# Patient Record
Sex: Female | Born: 1970 | Race: Black or African American | Hispanic: No | Marital: Single | Smoking: Current some day smoker
Health system: Southern US, Community
[De-identification: ages and names within clinical notes are randomized; demographics above are authoritative.]

## PROBLEM LIST (undated history)

## (undated) DIAGNOSIS — J45909 Unspecified asthma, uncomplicated: Secondary | ICD-10-CM

## (undated) HISTORY — PX: OTHER SURGICAL HISTORY: SHX169

---

## 2016-11-10 ENCOUNTER — Emergency Department
Admission: EM | Admit: 2016-11-10 | Discharge: 2016-11-10 | Disposition: A | Discharge status: Home / Self Care | Payer: Self-pay | Attending: Emergency Medicine | Admitting: Emergency Medicine

## 2016-11-10 ENCOUNTER — Emergency Department: Payer: Self-pay

## 2016-11-10 ENCOUNTER — Encounter: Payer: Self-pay | Admitting: Emergency Medicine

## 2016-11-10 DIAGNOSIS — F17200 Nicotine dependence, unspecified, uncomplicated: Secondary | ICD-10-CM | POA: Insufficient documentation

## 2016-11-10 DIAGNOSIS — J45909 Unspecified asthma, uncomplicated: Secondary | ICD-10-CM | POA: Insufficient documentation

## 2016-11-10 DIAGNOSIS — J209 Acute bronchitis, unspecified: Secondary | ICD-10-CM

## 2016-11-10 DIAGNOSIS — J219 Acute bronchiolitis, unspecified: Secondary | ICD-10-CM | POA: Insufficient documentation

## 2016-11-10 HISTORY — DX: Unspecified asthma, uncomplicated: J45.909

## 2016-11-10 LAB — INFLUENZA PANEL BY PCR (TYPE A & B)
INFLAPCR: NEGATIVE
INFLBPCR: NEGATIVE

## 2016-11-10 LAB — CBC
HEMATOCRIT: 35.4 % (ref 35.0–47.0)
HEMOGLOBIN: 12.5 g/dL (ref 12.0–16.0)
MCH: 34.4 pg — ABNORMAL HIGH (ref 26.0–34.0)
MCHC: 35.3 g/dL (ref 32.0–36.0)
MCV: 97.6 fL (ref 80.0–100.0)
Platelets: 110 10*3/uL — ABNORMAL LOW (ref 150–440)
RBC: 3.63 MIL/uL — ABNORMAL LOW (ref 3.80–5.20)
RDW: 13.4 % (ref 11.5–14.5)
WBC: 11.4 10*3/uL — AB (ref 3.6–11.0)

## 2016-11-10 LAB — COMPREHENSIVE METABOLIC PANEL
ALK PHOS: 79 U/L (ref 38–126)
ALT: 11 U/L — ABNORMAL LOW (ref 14–54)
ANION GAP: 8 (ref 5–15)
AST: 18 U/L (ref 15–41)
Albumin: 4.4 g/dL (ref 3.5–5.0)
BILIRUBIN TOTAL: 0.8 mg/dL (ref 0.3–1.2)
BUN: 10 mg/dL (ref 6–20)
CALCIUM: 8.8 mg/dL — AB (ref 8.9–10.3)
CO2: 24 mmol/L (ref 22–32)
Chloride: 105 mmol/L (ref 101–111)
Creatinine, Ser: 0.86 mg/dL (ref 0.44–1.00)
GLUCOSE: 126 mg/dL — AB (ref 65–99)
POTASSIUM: 3.7 mmol/L (ref 3.5–5.1)
Sodium: 137 mmol/L (ref 135–145)
TOTAL PROTEIN: 7.4 g/dL (ref 6.5–8.1)

## 2016-11-10 LAB — LIPASE, BLOOD: Lipase: 20 U/L (ref 11–51)

## 2016-11-10 MED ORDER — ALBUTEROL SULFATE HFA 108 (90 BASE) MCG/ACT IN AERS: 2.0000 | INHALATION_SPRAY | Freq: Four times a day (QID) | RESPIRATORY_TRACT | 2 refills | Status: DC | PRN

## 2016-11-10 MED ORDER — AZITHROMYCIN 500 MG PO TABS
500.0000 mg | ORAL_TABLET | Freq: Once | ORAL | Status: AC
  Administered 2016-11-10: 500 mg via ORAL
  Filled 2016-11-10: qty 1

## 2016-11-10 MED ORDER — ONDANSETRON 4 MG PO TBDP
4.0000 mg | ORAL_TABLET | Freq: Once | ORAL | Status: AC | PRN
  Administered 2016-11-10: 4 mg via ORAL
  Filled 2016-11-10: qty 1

## 2016-11-10 MED ORDER — AZITHROMYCIN 250 MG PO TABS: ORAL_TABLET | ORAL | 0 refills | Status: AC

## 2016-11-10 MED ORDER — ALBUTEROL SULFATE HFA 108 (90 BASE) MCG/ACT IN AERS: 2.0000 | INHALATION_SPRAY | Freq: Four times a day (QID) | RESPIRATORY_TRACT | 0 refills | Status: AC | PRN

## 2016-11-10 MED ORDER — HYDROCOD POLST-CPM POLST ER 10-8 MG/5ML PO SUER: 5.0000 mL | Freq: Two times a day (BID) | ORAL | 0 refills | Status: DC | PRN

## 2016-11-10 MED ORDER — HYDROCOD POLST-CPM POLST ER 10-8 MG/5ML PO SUER
5.0000 mL | Freq: Once | ORAL | Status: AC
  Administered 2016-11-10: 5 mL via ORAL
  Filled 2016-11-10: qty 5

## 2016-11-10 MED ORDER — ACETAMINOPHEN 325 MG PO TABS
650.0000 mg | ORAL_TABLET | Freq: Once | ORAL | Status: AC
  Administered 2016-11-10: 650 mg via ORAL
  Filled 2016-11-10: qty 2

## 2016-11-10 NOTE — ED Triage Notes (Signed)
Pt to triage via WC, reports fever, body aches, cough, vomiting since yesterday.  Pt dry heaving at this time.

## 2016-11-10 NOTE — ED Provider Notes (Signed)
[facility] Emergency Department Provider Note   First MD Initiated Contact with Patient 11/10/16 [phone]     (approximate)  I have reviewed the triage vital signs and the nursing notes.   HISTORY  Chief Complaint Cough; Fever; and Emesis   HPI Cindy Moon is a 46 y.o. female presents with one-day history of generalized body aches fever and nonproductive cough. Patient admits to daily tobacco use approximately 3 cigarettes.   Past Medical History:  Diagnosis Date  . Asthma     There are no active problems to display for this patient.   Past surgical history None  Prior to Admission medications   Not on File    Allergies Sulfa antibiotics  History reviewed. No pertinent family history.  Social History Social History  Substance Use Topics  . Smoking status: Current Some Day Smoker  . Smokeless tobacco: Never Used  . Alcohol use Yes    Review of Systems Constitutional: No fever/chills Eyes: No visual changes. ENT: No sore throat. Cardiovascular: Denies chest pain. Respiratory: Denies shortness of breath. Gastrointestinal: No abdominal pain.  No nausea, no vomiting.  No diarrhea.  No constipation. Genitourinary: Negative for dysuria. Musculoskeletal: Negative for back pain. Skin: Negative for rash. Neurological: Negative for headaches, focal weakness or numbness.  10-point ROS otherwise negative.  ____________________________________________   PHYSICAL EXAM:  VITAL SIGNS: ED Triage Vitals  Enc Vitals Group     BP 11/10/16 0222 126/81     Pulse Rate 11/10/16 0222 (!) 116     Resp 11/10/16 0222 20     Temp 11/10/16 0222 100 F (37.8 C)     Temp Source 11/10/16 0222 Oral     SpO2 11/10/16 0222 99 %     Weight 11/10/16 0223 166 lb (75.3 kg)     Height 11/10/16 0223 5\' 10"  (1.778 m)     Head Circumference --      Peak Flow --      Pain Score 11/10/16 0223 10     Pain Loc --      Pain Edu? --      Excl. in GC? --      Constitutional: Alert and oriented. Well appearing and in no acute distress. Eyes: Conjunctivae are normal. PERRL. EOMI. Head: Atraumatic. Ears:  Healthy appearing ear canals and TMs bilaterally Nose: No congestion/rhinnorhea. Mouth/Throat: Mucous membranes are moist.  Oropharynx non-erythematous. Neck: No stridor.  Cardiovascular: Normal rate, regular rhythm. Good peripheral circulation. Grossly normal heart sounds. Respiratory: Normal respiratory effort.  No retractions. Lungs CTAB. Gastrointestinal: Soft and nontender. No distention.  Musculoskeletal: No lower extremity tenderness nor edema. No gross deformities of extremities. Neurologic:  Normal speech and language. No gross focal neurologic deficits are appreciated.  Skin:  Skin is warm, dry and intact. No rash noted.   ____________________________________________   LABS (all labs ordered are listed, but only abnormal results are displayed)  Labs Reviewed  COMPREHENSIVE METABOLIC PANEL - Abnormal; Notable for the following:       Result Value   Glucose, Bld 126 (*)    Calcium 8.8 (*)    ALT 11 (*)    All other components within normal limits  CBC - Abnormal; Notable for the following:    WBC 11.4 (*)    RBC 3.63 (*)    MCH 34.4 (*)    Platelets 110 (*)    All other components within normal limits  LIPASE, BLOOD  INFLUENZA PANEL BY PCR (TYPE A & B, H1N1)  RADIOLOGY I, [place] Collie Wernick, personally viewed and evaluated these images (plain radiographs) as part of my medical decision making, as well as reviewing the written report by the radiologist.  Dg Chest 2 View  Result Date: 11/10/2016 CLINICAL DATA:  Fever, body aches, cough, and vomiting for 1 day. Current smoker. EXAM: CHEST  2 VIEW COMPARISON:  None. FINDINGS: Hyperinflation suggesting asthma or emphysema. Normal heart size and pulmonary vascularity. No focal airspace disease or consolidation in the lungs. No blunting of costophrenic angles. No  pneumothorax. Mediastinal contours appear intact. IMPRESSION: No active cardiopulmonary disease. Electronically Signed   By: Burman NievesWilliam  Stevens M.D.   On: 11/10/2016 05:34    Procedures     INITIAL IMPRESSION / ASSESSMENT AND PLAN / ED COURSE  Pertinent labs & imaging results that were available during my care of the patient were reviewed by me and considered in my medical decision making (see chart for details).  Patient given azithromycin and tonsillectomy emergency department will be prescribed the same in addition to albuterol inhaler for home.   Clinical Course     ____________________________________________  FINAL CLINICAL IMPRESSION(S) / ED DIAGNOSES  Final diagnoses:  Acute bronchitis, unspecified organism     MEDICATIONS GIVEN DURING THIS VISIT:  Medications  azithromycin (ZITHROMAX) tablet 500 mg (not administered)  chlorpheniramine-HYDROcodone (TUSSIONEX) 10-8 MG/5ML suspension 5 mL (not administered)  ondansetron (ZOFRAN-ODT) disintegrating tablet 4 mg (4 mg Oral Given 11/10/16 0230)  acetaminophen (TYLENOL) tablet 650 mg (650 mg Oral Given 11/10/16 0230)     NEW OUTPATIENT MEDICATIONS STARTED DURING THIS VISIT:  New Prescriptions   No medications on file    Modified Medications   No medications on file    Discontinued Medications   No medications on file     Note:  This document was prepared using Dragon voice recognition software and may include unintentional dictation errors.    Darci Currentandolph N Radhika Dershem, MD 11/10/16 [phone]

## 2017-04-06 ENCOUNTER — Encounter: Payer: Self-pay | Admitting: Emergency Medicine

## 2017-04-06 ENCOUNTER — Emergency Department: Payer: Self-pay

## 2017-04-06 ENCOUNTER — Emergency Department
Admission: EM | Admit: 2017-04-06 | Discharge: 2017-04-06 | Disposition: A | Discharge status: Home / Self Care | Payer: Self-pay | Attending: Emergency Medicine | Admitting: Emergency Medicine

## 2017-04-06 DIAGNOSIS — R1084 Generalized abdominal pain: Secondary | ICD-10-CM | POA: Insufficient documentation

## 2017-04-06 DIAGNOSIS — J45909 Unspecified asthma, uncomplicated: Secondary | ICD-10-CM | POA: Insufficient documentation

## 2017-04-06 DIAGNOSIS — R300 Dysuria: Secondary | ICD-10-CM | POA: Insufficient documentation

## 2017-04-06 DIAGNOSIS — F17200 Nicotine dependence, unspecified, uncomplicated: Secondary | ICD-10-CM | POA: Insufficient documentation

## 2017-04-06 LAB — CHLAMYDIA/NGC RT PCR (ARMC ONLY)
Chlamydia Tr: NOT DETECTED
N gonorrhoeae: NOT DETECTED

## 2017-04-06 LAB — CBC
HCT: 35.9 % (ref 35.0–47.0)
Hemoglobin: 12.4 g/dL (ref 12.0–16.0)
MCH: 33.9 pg (ref 26.0–34.0)
MCHC: 34.5 g/dL (ref 32.0–36.0)
MCV: 98.2 fL (ref 80.0–100.0)
PLATELETS: 144 10*3/uL — AB (ref 150–440)
RBC: 3.66 MIL/uL — ABNORMAL LOW (ref 3.80–5.20)
RDW: 13.5 % (ref 11.5–14.5)
WBC: 6.6 10*3/uL (ref 3.6–11.0)

## 2017-04-06 LAB — HEPATIC FUNCTION PANEL
ALT: 12 U/L — ABNORMAL LOW (ref 14–54)
AST: 21 U/L (ref 15–41)
Albumin: 4.2 g/dL (ref 3.5–5.0)
Alkaline Phosphatase: 68 U/L (ref 38–126)
BILIRUBIN TOTAL: 0.4 mg/dL (ref 0.3–1.2)
Total Protein: 7.6 g/dL (ref 6.5–8.1)

## 2017-04-06 LAB — PREGNANCY, URINE: Preg Test, Ur: NEGATIVE

## 2017-04-06 LAB — WET PREP, GENITAL
Clue Cells Wet Prep HPF POC: NONE SEEN
SPERM: NONE SEEN
Trich, Wet Prep: NONE SEEN
Yeast Wet Prep HPF POC: NONE SEEN

## 2017-04-06 LAB — BASIC METABOLIC PANEL
Anion gap: 7 (ref 5–15)
BUN: 13 mg/dL (ref 6–20)
CHLORIDE: 107 mmol/L (ref 101–111)
CO2: 25 mmol/L (ref 22–32)
CREATININE: 0.84 mg/dL (ref 0.44–1.00)
Calcium: 8.9 mg/dL (ref 8.9–10.3)
GFR calc Af Amer: >60 mL/min (ref >60)
GFR calc non Af Amer: >60 mL/min (ref >60)
Glucose, Bld: 96 mg/dL (ref 65–99)
Potassium: 3.9 mmol/L (ref 3.5–5.1)
SODIUM: 139 mmol/L (ref 135–145)

## 2017-04-06 LAB — URINALYSIS, COMPLETE (UACMP) WITH MICROSCOPIC
BACTERIA UA: NONE SEEN
BILIRUBIN URINE: NEGATIVE
Glucose, UA: NEGATIVE mg/dL
HGB URINE DIPSTICK: NEGATIVE
Ketones, ur: NEGATIVE mg/dL
LEUKOCYTES UA: NEGATIVE
NITRITE: NEGATIVE
Protein, ur: NEGATIVE mg/dL
SPECIFIC GRAVITY, URINE: 1.015 (ref 1.005–1.030)
pH: 7 (ref 5.0–8.0)

## 2017-04-06 LAB — LIPASE, BLOOD: LIPASE: 33 U/L (ref 11–51)

## 2017-04-06 MED ORDER — ONDANSETRON HCL 4 MG/2ML IJ SOLN
4.0000 mg | Freq: Once | INTRAMUSCULAR | Status: AC
  Administered 2017-04-06: 4 mg via INTRAVENOUS

## 2017-04-06 MED ORDER — ONDANSETRON HCL 4 MG/2ML IJ SOLN
INTRAMUSCULAR | Status: AC
  Filled 2017-04-06: qty 2

## 2017-04-06 MED ORDER — MORPHINE SULFATE (PF) 4 MG/ML IV SOLN
4.0000 mg | Freq: Once | INTRAVENOUS | Status: AC
  Administered 2017-04-06: 4 mg via INTRAVENOUS
  Filled 2017-04-06: qty 1

## 2017-04-06 MED ORDER — HYDROCODONE-ACETAMINOPHEN 5-325 MG PO TABS
1.0000 | ORAL_TABLET | ORAL | Status: AC
  Administered 2017-04-06: 1 via ORAL
  Filled 2017-04-06: qty 1

## 2017-04-06 MED ORDER — FOSFOMYCIN TROMETHAMINE 3 G PO PACK
3.0000 g | PACK | ORAL | Status: AC
  Administered 2017-04-06: 3 g via ORAL
  Filled 2017-04-06: qty 3

## 2017-04-06 MED ORDER — ONDANSETRON HCL 4 MG/2ML IJ SOLN
4.0000 mg | Freq: Once | INTRAMUSCULAR | Status: AC
  Administered 2017-04-06: 4 mg via INTRAVENOUS
  Filled 2017-04-06: qty 2

## 2017-04-06 MED ORDER — KETOROLAC TROMETHAMINE 30 MG/ML IJ SOLN
60.0000 mg | Freq: Once | INTRAMUSCULAR | Status: AC
  Administered 2017-04-06: 60 mg via INTRAMUSCULAR
  Filled 2017-04-06: qty 2

## 2017-04-06 MED ORDER — HYDROCODONE-ACETAMINOPHEN 5-325 MG PO TABS
1.0000 | ORAL_TABLET | Freq: Once | ORAL | Status: AC
  Administered 2017-04-06: 1 via ORAL
  Filled 2017-04-06: qty 1

## 2017-04-06 MED ORDER — HYDROCODONE-ACETAMINOPHEN 5-325 MG PO TABS: 1.0000 | ORAL_TABLET | Freq: Four times a day (QID) | ORAL | 0 refills | Status: DC | PRN

## 2017-04-06 NOTE — ED Provider Notes (Addendum)
[facility] Emergency Department Provider Note   ____________________________________________   First MD Initiated Contact with Patient 04/06/17 1104     (approximate)  I have reviewed the triage vital signs and the nursing notes.   HISTORY  Chief Complaint Flank Pain    HPI Cindy Moon is a 46 y.o. female reports about one week ago she began experiencing "a UTI" which she describes as a feeling of an unusual discomfort or pain whenever she was urinating. She has not noticed any fevers or chills, she has not noticed any abnormal odor to her urine or any cloudiness.  Yesterday began experiencing a sharp pain in the left mid flank that radiates up towards her left lower back. She consulted with friends and they thought she might have a urinary infection, or possibly kidney stone.  No nausea or vomiting. No diarrhea. She is eating and drinking normally.  No numbness or tingling. No weakness in the legs. No ripping tearing or moving discomfort. No pain in the middle of the abdomen or mid back.  Currently reports some moderate to severe pain in the left flank  Past Medical History:  Diagnosis Date  . Asthma     There are no active problems to display for this patient.   History reviewed. No pertinent surgical history.  Prior to Admission medications   Medication Sig Start Date End Date Taking? Authorizing Provider  albuterol (PROVENTIL HFA;VENTOLIN HFA) 108 (90 Base) MCG/ACT inhaler Inhale 2 puffs into the lungs every 6 (six) hours as needed for wheezing or shortness of breath. 11/10/16  Yes Darci Current, MD  phenazopyridine (PYRIDIUM) 97 MG tablet Take 97 mg by mouth 3 (three) times daily as needed for pain.   Yes [provider]  HYDROcodone-acetaminophen (NORCO/VICODIN) 5-325 MG tablet Take 1 tablet by mouth every 6 (six) hours as needed for moderate pain. 04/06/17   Sharyn Creamer, MD    Allergies Sulfa antibiotics  No family history  on file.  Social History Social History  Substance Use Topics  . Smoking status: Current Some Day Smoker  . Smokeless tobacco: Never Used  . Alcohol use Yes    Review of Systems Constitutional: No fever/chills Eyes: No visual changes. ENT: No sore throat. Cardiovascular: Denies chest pain. Respiratory: Denies shortness of breath. Gastrointestinal: No abdominal pain.  No nausea, no vomiting. See history of present illness regarding flank pain No diarrhea.  No constipation. Genitourinary: See history of present illness  Denies pregnancy. Denies any vaginal discharge or bleeding. Questions if she may be in menopause as last period was about 2 years ago. Musculoskeletal: See history of present illness. No falls or injuries Skin: Negative for rash. Neurological: Negative for headaches, focal weakness or numbness.    ____________________________________________   PHYSICAL EXAM:  VITAL SIGNS: ED Triage Vitals  Enc Vitals Group     BP 04/06/17 0939 118/88     Pulse Rate 04/06/17 0939 78     Resp 04/06/17 0939 20     Temp 04/06/17 0939 97.7 F (36.5 C)     Temp Source 04/06/17 0939 Oral     SpO2 04/06/17 0939 100 %     Weight 04/06/17 0939 170 lb (77.1 kg)     Height 04/06/17 0939 5\' 10"  (1.778 m)     Head Circumference --      Peak Flow --      Pain Score 04/06/17 0941 10     Pain Loc --  Pain Edu? --      Excl. in GC? --     Constitutional: Alert and oriented. Well appearing and in no acute distress.She and her husband both very pleasant. Eyes: Conjunctivae are normal. Head: Atraumatic. Nose: No congestion/rhinnorhea. Mouth/Throat: Mucous membranes are moist. Neck: No stridor.   Cardiovascular: Normal rate, regular rhythm. Grossly normal heart sounds.  Good peripheral circulation. Respiratory: Normal respiratory effort.  No retractions. Lungs CTAB. Gastrointestinal: Soft and nontender except for mild pain to palpation in the left lower quadrant and flank. No  distention. There is moderate left CVA tenderness, none on the right. Musculoskeletal: No lower extremity tenderness nor edema. Neurologic:  Normal speech and language. No gross focal neurologic deficits are appreciated.  Skin:  Skin is warm, dry and intact. No rash noted. Psychiatric: Mood and affect are normal. Speech and behavior are normal.  ____________________________________________   LABS (all labs ordered are listed, but only abnormal results are displayed)  Labs Reviewed  WET PREP, GENITAL - Abnormal; Notable for the following:       Result Value   WBC, Wet Prep HPF POC MODERATE (*)    All other components within normal limits  URINALYSIS, COMPLETE (UACMP) WITH MICROSCOPIC - Abnormal; Notable for the following:    Color, Urine YELLOW (*)    APPearance CLEAR (*)    Squamous Epithelial / LPF 0-5 (*)    All other components within normal limits  CBC - Abnormal; Notable for the following:    RBC 3.66 (*)    Platelets 144 (*)    All other components within normal limits  HEPATIC FUNCTION PANEL - Abnormal; Notable for the following:    ALT 12 (*)    Bilirubin, Direct <0.1 (*)    All other components within normal limits  CHLAMYDIA/NGC RT PCR ([facility] ONLY)  BASIC METABOLIC PANEL  PREGNANCY, URINE  LIPASE, BLOOD   ____________________________________________  EKG   ____________________________________________  RADIOLOGY  [place] Transvaginal Non-ob  Result Date: 04/06/2017 CLINICAL DATA:  Left pelvic pain EXAM: TRANSABDOMINAL AND TRANSVAGINAL ULTRASOUND OF PELVIS TECHNIQUE: Both transabdominal and transvaginal ultrasound examinations of the pelvis were performed. Transabdominal technique was performed for global imaging of the pelvis including uterus, ovaries, adnexal regions, and pelvic cul-de-sac. It was necessary to proceed with endovaginal exam following the transabdominal exam to visualize the uterus and ovaries. COMPARISON:  CT abdomen pelvis 04/06/2017  FINDINGS: Uterus Measurements: 7.2 x 3.9 x 4.1 cm. No fibroids or other mass visualized. Endometrium Thickness: 3 mm.  No focal abnormality visualized. Right ovary Not clearly visualized, possibly obscured by nearby bowel. Left ovary Not clearly visualized, possibly obscured by nearby bowel. Other findings No abnormal free fluid. IMPRESSION: 1. Normal appearance of the uterus. 2. Nonvisualization of the ovaries due to nearby bowel gas. 3. No free fluid in the pelvis. Electronically Signed   By: Deatra RobinsonKevin  Herman M.D.   On: 04/06/2017 19:34   [place] Pelvis Complete  Result Date: 04/06/2017 CLINICAL DATA:  Left pelvic pain EXAM: TRANSABDOMINAL AND TRANSVAGINAL ULTRASOUND OF PELVIS TECHNIQUE: Both transabdominal and transvaginal ultrasound examinations of the pelvis were performed. Transabdominal technique was performed for global imaging of the pelvis including uterus, ovaries, adnexal regions, and pelvic cul-de-sac. It was necessary to proceed with endovaginal exam following the transabdominal exam to visualize the uterus and ovaries. COMPARISON:  CT abdomen pelvis 04/06/2017 FINDINGS: Uterus Measurements: 7.2 x 3.9 x 4.1 cm. No fibroids or other mass visualized. Endometrium Thickness: 3 mm.  No focal abnormality visualized. Right ovary Not clearly  visualized, possibly obscured by nearby bowel. Left ovary Not clearly visualized, possibly obscured by nearby bowel. Other findings No abnormal free fluid. IMPRESSION: 1. Normal appearance of the uterus. 2. Nonvisualization of the ovaries due to nearby bowel gas. 3. No free fluid in the pelvis. Electronically Signed   By: Deatra Robinson M.D.   On: 04/06/2017 19:34   Ct Renal Stone Study  Result Date: 04/06/2017 CLINICAL DATA:  Left flank pain EXAM: CT ABDOMEN AND PELVIS WITHOUT CONTRAST TECHNIQUE: Multidetector CT imaging of the abdomen and pelvis was performed following the standard protocol without oral or intravenous contrast material administration. COMPARISON:  None.  FINDINGS: Lower chest: Lung bases are clear. Hepatobiliary: No focal liver lesions are apparent on this noncontrast enhanced study. Gallbladder wall is not appreciably thickened. There is no biliary duct dilatation. Pancreas: There is no pancreatic mass or inflammatory focus. Spleen: No splenic lesions are evident. Adrenals/Urinary Tract: Adrenals appear normal bilaterally. Kidneys bilaterally show no evidence of mass or hydronephrosis on either side. There is no appreciable renal or ureteral calculus on either side. Urinary bladder is midline with wall thickness within normal limits. Stomach/Bowel: There is no appreciable bowel wall or mesenteric thickening. There is no bowel obstruction. No free air or portal venous air. Vascular/Lymphatic: There is no abdominal aortic aneurysm. No vascular lesion evident on this noncontrast enhanced study. There is no appreciable adenopathy in the abdomen or pelvis. Reproductive: Uterus is anteverted.  There is no pelvic mass. Other: There is a sebaceous cyst in the posterior right buttocks region may reassuring 1.8 x 1.5 cm. Appendix appears unremarkable. There is no ascites or abscess in the abdomen or pelvis. There is a small ventral hernia containing only fat. There is fat in each inguinal ring. There is a 5 mm focus of calcification mesenteric a of the anterior abdomen near the umbilicus. No similar calcifications seen elsewhere. Musculoskeletal: There are no blastic or lytic bone lesions. There is no intramuscular or abdominal wall lesion. : A cause for patient's symptoms has not been established with this study. No renal or ureteral calculus. No hydronephrosis. Urinary bladder wall thickness is normal. No bowel wall thickening or bowel obstruction. No abscess. Appendix appears normal. Sebaceous cyst in the posterior right buttocks region. Small ventral hernia containing only fat. Small calcification in the anterior abdomen without surrounding edema or mesenteric stranding.  This isolated small calcification may have posttraumatic etiology. Its etiology is frankly uncertain. It appears benign. Electronically Signed   By: Bretta Bang III M.D.   On: 04/06/2017 12:54    ____________________________________________   PROCEDURES  Procedure(s) performed: None  Procedures  Critical Care performed: No  ____________________________________________   INITIAL IMPRESSION / ASSESSMENT AND PLAN / ED COURSE  Pertinent labs & imaging results that were available during my care of the patient were reviewed by me and considered in my medical decision making (see chart for details).  Differential diagnosis includes but is not limited to, abdominal perforation, aortic dissection, cholecystitis, appendicitis, diverticulitis, colitis, esophagitis/gastritis, kidney stone, pyelonephritis, urinary tract infection,etc All are considered in decision and treatment plan. Based upon the patient's presentation and risk factors, she reports dysuria for about a week with associated now sharp left flank pain. Sounds primarily of a probable renal, bladder or possible ureteral source but also consider pelvic etiologies. No ripping tearing or moving pain, no signs of aneurysm. Equal and normal pulses bilaterally without any neurologic symptoms or evidence of AAA, peritonitis, mesenteric ischemia, or dissection.  No cardiac or pulmonary symptoms  Clinical Course as of Apr 06 2113  Wynelle Link Apr 06, 2017  1154 CT Renal Stone Study [MQ]  1241 Rechecked on patient. She is now resting, reports pain is much improved. Appears in no distress. Awaiting results of CT  [MQ]  1747 Pelvic exam performed with Dawayne Cirri as well as [place].No cervical motion tenderness. No adnexal fullness or pain detected. The patient does have some mild somewhat thin white discharge from the os which is been sent for wet prep and GC. Patient reports only sexually active with her husband  [MQ]    Clinical Course User  Index [MQ] Sharyn Creamer, MD    ----------------------------------------- 9:12 PM on 04/06/2017 -----------------------------------------  Patient's pain is improving. She is awake and alert. He is been seen in consult by gynecology, report that they find no evidence of acute GYN source. The patient feels comfortable now, nourished requesting to go home. I reevaluated her, she appears comfortable, alert in no distress. Recommended she follow up with Dr. Logan Bores given the location of her pain, as well as primary care.  I will prescribe the patient a narcotic pain medicine due to their condition which I anticipate will cause at least moderate pain short term. I discussed with the patient safe use of narcotic pain medicines, and that they are not to drive, work in dangerous areas, or ever take more than prescribed (no more than 1 pill every 6 hours). We discussed that this is the type of medication that can be  overdosed on and the risks of this type of medicine. Patient is very agreeable to only use as prescribed and to never use more than prescribed.   Return precautions and treatment recommendations and follow-up discussed with the patient who is agreeable with the plan.  ____________________________________________   FINAL CLINICAL IMPRESSION(S) / ED DIAGNOSES  Final diagnoses:  Dysuria  left flank pain, acute    NEW MEDICATIONS STARTED DURING THIS VISIT:  New Prescriptions   HYDROCODONE-ACETAMINOPHEN (NORCO/VICODIN) 5-325 MG TABLET    Take 1 tablet by mouth every 6 (six) hours as needed for moderate pain.     Note:  This document was prepared using Dragon voice recognition software and may include unintentional dictation errors.     Sharyn Creamer, MD 04/06/17 2114    Sharyn Creamer, MD 04/06/17 2114

## 2017-04-06 NOTE — ED Notes (Signed)
Pt given urine specimen cup; unable to void at this time. 

## 2017-04-06 NOTE — Discharge Instructions (Signed)
You were seen in the emergency room for left flank and abdominal pain. It is important that you follow up closely with your primary care doctor in the next couple of days.  Please return to the emergency room right away if you are to develop a fever, severe nausea, your pain becomes severe or worsens, you are unable to keep food down, begin vomiting any dark or bloody fluid, you develop any dark or bloody stools, feel dehydrated, or other new concerns or symptoms arise.

## 2017-04-06 NOTE — ED Notes (Signed)
Called X 3 for room, no answer

## 2017-04-06 NOTE — ED Triage Notes (Signed)
Patient presents to the ED with sharp left flank pain since yesterday.  Patient reports UTI symptoms that began about 1 week ago.  Patient states she has been taking over the counter, AZO without relief.  Patient reports continued dysuria, frequency and urgency.  Patient states it is very painful now to bend over.

## 2017-05-13 ENCOUNTER — Emergency Department
Admission: EM | Admit: 2017-05-13 | Discharge: 2017-05-13 | Discharge status: Against Medical Advice | Payer: Self-pay | Attending: Emergency Medicine | Admitting: Emergency Medicine

## 2017-05-13 DIAGNOSIS — Z5321 Procedure and treatment not carried out due to patient leaving prior to being seen by health care provider: Secondary | ICD-10-CM | POA: Insufficient documentation

## 2017-05-13 DIAGNOSIS — F411 Generalized anxiety disorder: Secondary | ICD-10-CM | POA: Insufficient documentation

## 2017-05-13 LAB — BASIC METABOLIC PANEL
ANION GAP: 10 (ref 5–15)
BUN: 10 mg/dL (ref 6–20)
CALCIUM: 9.4 mg/dL (ref 8.9–10.3)
CO2: 23 mmol/L (ref 22–32)
Chloride: 107 mmol/L (ref 101–111)
Creatinine, Ser: 0.84 mg/dL (ref 0.44–1.00)
Glucose, Bld: 97 mg/dL (ref 65–99)
Potassium: 3.6 mmol/L (ref 3.5–5.1)
SODIUM: 140 mmol/L (ref 135–145)

## 2017-05-13 LAB — CBC
HCT: 36.9 % (ref 35.0–47.0)
HEMOGLOBIN: 12.8 g/dL (ref 12.0–16.0)
MCH: 34.5 pg — ABNORMAL HIGH (ref 26.0–34.0)
MCHC: 34.8 g/dL (ref 32.0–36.0)
MCV: 99.2 fL (ref 80.0–100.0)
Platelets: 123 10*3/uL — ABNORMAL LOW (ref 150–440)
RBC: 3.71 MIL/uL — AB (ref 3.80–5.20)
RDW: 13.5 % (ref 11.5–14.5)
WBC: 7.2 10*3/uL (ref 3.6–11.0)

## 2017-05-13 NOTE — ED Notes (Signed)
Called in waiting room.  No answer. 

## 2017-05-13 NOTE — ED Triage Notes (Signed)
Pt comes into the ED via EMS from home. States her 2 dogs were fighting and she spent an hour breaking them apart. Pt states it started like an asthma attack and then turned into a panic attack and her muscles locked up . Pt now c/o just being sore all over. Respirations WNL

## 2017-11-05 ENCOUNTER — Encounter: Payer: Self-pay | Admitting: Emergency Medicine

## 2017-11-05 ENCOUNTER — Emergency Department
Admission: EM | Admit: 2017-11-05 | Discharge: 2017-11-05 | Disposition: A | Discharge status: Home / Self Care | Payer: Self-pay | Attending: Emergency Medicine | Admitting: Emergency Medicine

## 2017-11-05 ENCOUNTER — Other Ambulatory Visit: Payer: Self-pay

## 2017-11-05 DIAGNOSIS — J40 Bronchitis, not specified as acute or chronic: Secondary | ICD-10-CM | POA: Insufficient documentation

## 2017-11-05 DIAGNOSIS — F17200 Nicotine dependence, unspecified, uncomplicated: Secondary | ICD-10-CM | POA: Insufficient documentation

## 2017-11-05 DIAGNOSIS — J069 Acute upper respiratory infection, unspecified: Secondary | ICD-10-CM | POA: Insufficient documentation

## 2017-11-05 MED ORDER — AZITHROMYCIN 250 MG PO TABS: ORAL_TABLET | ORAL | 0 refills | Status: DC

## 2017-11-05 MED ORDER — GUAIFENESIN-CODEINE 100-10 MG/5ML PO SOLN: 10.0000 mL | Freq: Three times a day (TID) | ORAL | 0 refills | Status: DC | PRN

## 2017-11-05 NOTE — ED Provider Notes (Signed)
[facility] Emergency Department Provider Note  ____________________________________________  Time seen: Approximately 7:19 PM  I have reviewed the triage vital signs and the nursing notes.   HISTORY  Chief Complaint URI   HPI Encarnacion Slatesara Ruhlman is a 47 y.o. female who presents to the emergency department for evaluation and treatment of a cough times 3 weeks.  Runny nose and sore throat started approximately 1 week ago.  Subjective fever.  No relief with over-the-counter medications.  Patient has a significant past medical history for asthma.  She does smoke cigarettes. Past Medical History:  Diagnosis Date  . Asthma     There are no active problems to display for this patient.   No past surgical history on file.  Prior to Admission medications   Medication Sig Start Date End Date Taking? Authorizing Provider  albuterol (PROVENTIL HFA;VENTOLIN HFA) 108 (90 Base) MCG/ACT inhaler Inhale 2 puffs into the lungs every 6 (six) hours as needed for wheezing or shortness of breath. 11/10/16   Darci CurrentBrown, [place] N, MD  azithromycin (ZITHROMAX) 250 MG tablet 2 tablets today, then 1 tablet for the next 4 days. 11/05/17   Soleia Badolato B, FNP  guaiFENesin-codeine 100-10 MG/5ML syrup Take 10 mLs by mouth 3 (three) times daily as needed. 11/05/17   Abbi Mancini, Kasandra Knudsenari B, FNP  phenazopyridine (PYRIDIUM) 97 MG tablet Take 97 mg by mouth 3 (three) times daily as needed for pain.    [provider]    Allergies Sulfa antibiotics  No family history on file.  Social History Social History   Tobacco Use  . Smoking status: Current Some Day Smoker  . Smokeless tobacco: Never Used  Substance Use Topics  . Alcohol use: Yes  . Drug use: No    Review of Systems Constitutional: Positive for fever/chills ENT: Positive sore throat. Cardiovascular: Denies chest pain. Respiratory: Negative for shortness of breath.  Positive for cough. Gastrointestinal: Negative for nausea, no  vomiting.  No diarrhea.  Musculoskeletal: Positive for body aches Skin: Negative for rash. Neurological: Negative for headaches ____________________________________________   PHYSICAL EXAM:  VITAL SIGNS: ED Triage Vitals  Enc Vitals Group     BP 11/05/17 1813 120/81     Pulse Rate 11/05/17 1813 88     Resp 11/05/17 1813 18     Temp 11/05/17 1813 97.6 F (36.4 C)     Temp Source 11/05/17 1813 Oral     SpO2 11/05/17 1813 100 %     Weight 11/05/17 1814 170 lb (77.1 kg)     Height 11/05/17 1814 5\' 10"  (1.778 m)     Head Circumference --      Peak Flow --      Pain Score 11/05/17 1813 8     Pain Loc --      Pain Edu? --      Excl. in GC? --     Constitutional: Alert and oriented.  Acutely ill appearing and in no acute distress. Eyes: Conjunctivae are normal. EOMI. Nose: Sinus congestion noted; no rhinnorhea. Mouth/Throat: Mucous membranes are moist.  Oropharynx clear. Tonsils clear. Neck: No stridor.  Lymphatic: No cervical lymphadenopathy. Cardiovascular: Normal rate, regular rhythm. Good peripheral circulation. Respiratory: Normal respiratory effort.  No retractions.  Rhonchi in the right lower lobe that clears after cough. Gastrointestinal: Soft and nontender.  Musculoskeletal: FROM x 4 extremities.  Neurologic:  Normal speech and language.  Skin:  Skin is warm, dry and intact. No rash noted. Psychiatric: Mood and affect are normal. Speech and  behavior are normal.  ____________________________________________   LABS (all labs ordered are listed, but only abnormal results are displayed)  Labs Reviewed - No data to display ____________________________________________  EKG  Not indicated ____________________________________________  RADIOLOGY  Not indicated ____________________________________________   PROCEDURES  Procedure(s) performed: None  Critical Care performed: No ____________________________________________   INITIAL IMPRESSION / ASSESSMENT  AND PLAN / ED COURSE  47 year old female with a 3-week history of cough and 1 week history of sore throat and rhinorrhea.  Symptoms and exam are most consistent with a bronchitis and superimposed viral respiratory infection.  She will be given a prescription for azithromycin and Robitussin-AC.  She was encouraged to continue using her albuterol inhaler as prescribed.  Patient will be advised to follow-up with the primary care provider for choice for symptoms that are not improving over the next several days or return to the emergency department for symptoms of concern.  Medications - No data to display  ED Discharge Orders        Ordered    azithromycin (ZITHROMAX) 250 MG tablet     11/05/17 1925    guaiFENesin-codeine 100-10 MG/5ML syrup  3 times daily PRN     11/05/17 1925      Pertinent labs & imaging results that were available during my care of the patient were reviewed by me and considered in my medical decision making (see chart for details).    If controlled substance prescribed during this visit, 12 month history viewed on the [place] prior to issuing an initial prescription for Schedule II or III opiod. ____________________________________________   FINAL CLINICAL IMPRESSION(S) / ED DIAGNOSES  Final diagnoses:  Upper respiratory tract infection, unspecified type  Bronchitis    Note:  This document was prepared using Dragon voice recognition software and may include unintentional dictation errors.     Chinita Pester, FNP 11/05/17 1926    Sharman Cheek, MD 11/05/17 [phone]

## 2017-11-05 NOTE — ED Notes (Signed)
See triage note. Cough,runny nose and sore throat which started about 1 week ago  Unsure of fever but afebrile on arrival

## 2017-11-05 NOTE — ED Triage Notes (Signed)
Pt reports cough, runny nose and sore throat for one week.

## 2018-01-24 ENCOUNTER — Other Ambulatory Visit: Payer: Self-pay

## 2018-01-24 ENCOUNTER — Encounter: Payer: Self-pay | Admitting: Emergency Medicine

## 2018-01-24 ENCOUNTER — Emergency Department
Admission: EM | Admit: 2018-01-24 | Discharge: 2018-01-24 | Disposition: A | Discharge status: Home / Self Care | Payer: Medicaid Other | Attending: Emergency Medicine | Admitting: Emergency Medicine

## 2018-01-24 DIAGNOSIS — J45909 Unspecified asthma, uncomplicated: Secondary | ICD-10-CM | POA: Insufficient documentation

## 2018-01-24 DIAGNOSIS — L304 Erythema intertrigo: Secondary | ICD-10-CM | POA: Insufficient documentation

## 2018-01-24 DIAGNOSIS — F17210 Nicotine dependence, cigarettes, uncomplicated: Secondary | ICD-10-CM | POA: Insufficient documentation

## 2018-01-24 MED ORDER — KETOCONAZOLE 2 % EX CREA: 1.0000 "application " | TOPICAL_CREAM | Freq: Every day | CUTANEOUS | 0 refills | Status: AC

## 2018-01-24 MED ORDER — HYDROCORTISONE 2.5 % EX CREA: TOPICAL_CREAM | Freq: Two times a day (BID) | CUTANEOUS | 0 refills | Status: AC

## 2018-01-24 NOTE — ED Notes (Signed)
Pt also reports a boil located around her anus. Pt reports it has been there for over a week but has recently grown in size and become more painful. No drainage or fevers reported.

## 2018-01-24 NOTE — ED Triage Notes (Signed)
C/O rash under breasts and chest x 1 week.  C/O rash itching.

## 2018-01-24 NOTE — ED Provider Notes (Signed)
[facility] Emergency Department Provider Note  ____________________________________________  Time seen: Approximately 11:17 AM  I have reviewed the triage vital signs and the nursing notes.   HISTORY  Chief Complaint Rash    HPI Cindy Moon is a 47 y.o. female that presents emergency department for evaluation of worsening pruritic rash under bilateral breasts for 1 week.  She is unaware of any medical problems but states she has not seen primary care in a long time.  No fever, nausea, vomiting, abdominal pain.  Past Medical History:  Diagnosis Date  . Asthma     There are no active problems to display for this patient.   History reviewed. No pertinent surgical history.  Prior to Admission medications   Medication Sig Start Date End Date Taking? Authorizing Provider  albuterol (PROVENTIL HFA;VENTOLIN HFA) 108 (90 Base) MCG/ACT inhaler Inhale 2 puffs into the lungs every 6 (six) hours as needed for wheezing or shortness of breath. 11/10/16   Darci CurrentBrown, [place] N, MD  azithromycin (ZITHROMAX) 250 MG tablet 2 tablets today, then 1 tablet for the next 4 days. 11/05/17   Triplett, Cari B, FNP  guaiFENesin-codeine 100-10 MG/5ML syrup Take 10 mLs by mouth 3 (three) times daily as needed. 11/05/17   Triplett, Cari B, FNP  hydrocortisone 2.5 % cream Apply topically 2 (two) times daily. 01/24/18   Enid DerryWagner, [place]iloh [place], PA-C  ketoconazole (NIZORAL) 2 % cream Apply 1 application topically daily. 01/24/18   Enid DerryWagner, Gaudencio Chesnut, PA-C  phenazopyridine (PYRIDIUM) 97 MG tablet Take 97 mg by mouth 3 (three) times daily as needed for pain.    [provider]    Allergies Sulfa antibiotics  No family history on file.  Social History Social History   Tobacco Use  . Smoking status: Current Some Day Smoker    Packs/day: 0.10    Types: Cigarettes  . Smokeless tobacco: Never Used  Substance Use Topics  . Alcohol use: Yes  . Drug use: No     Review of Systems   Gastrointestinal:  No nausea, no vomiting.  Musculoskeletal: Negative for musculoskeletal pain. Skin: Negative for abrasions, lacerations, ecchymosis.  Positive for rash. Neurological: Negative for numbness or tingling   ____________________________________________   PHYSICAL EXAM:  VITAL SIGNS: ED Triage Vitals  Enc Vitals Group     BP 01/24/18 1028 (!) 152/85     Pulse Rate 01/24/18 1028 84     Resp 01/24/18 1028 20     Temp 01/24/18 1028 (!) 97.4 F (36.3 C)     Temp Source 01/24/18 1028 Oral     SpO2 01/24/18 1028 99 %     Weight 01/24/18 1027 180 lb (81.6 kg)     Height 01/24/18 1027 5\' 7"  (1.702 m)     Head Circumference --      Peak Flow --      Pain Score 01/24/18 1026 0     Pain Loc --      Pain Edu? --      Excl. in GC? --      Constitutional: Alert and oriented. Well appearing and in no acute distress. Eyes: Conjunctivae are normal. PERRL. EOMI. Head: Atraumatic. ENT:      Ears:      Nose: No congestion/rhinnorhea.      Mouth/Throat: Mucous membranes are moist.  Neck: No stridor.  Cardiovascular: Normal rate, regular rhythm.  Good peripheral circulation. Respiratory: Normal respiratory effort without tachypnea or retractions. Lungs CTAB. Good air entry to the bases with no decreased  or absent breath sounds. Musculoskeletal: Full range of motion to all extremities. No gross deformities appreciated. Neurologic:  Normal speech and language. No gross focal neurologic deficits are appreciated.  Skin:  Skin is warm, dry and intact. Moist, red, beefy, homogenous patches under bilaterally breasts.   ____________________________________________   LABS (all labs ordered are listed, but only abnormal results are displayed)  Labs Reviewed - No data to display ____________________________________________  EKG   ____________________________________________  RADIOLOGY   No results  found.  ____________________________________________    PROCEDURES  Procedure(s) performed:    Procedures    Medications - No data to display   ____________________________________________   INITIAL IMPRESSION / ASSESSMENT AND PLAN / ED COURSE  Pertinent labs & imaging results that were available during my care of the patient were reviewed by me and considered in my medical decision making (see chart for details).  Review of the [place] CSRS was performed in accordance of the [facility] prior to dispensing any controlled drugs.     Patient's diagnosis is consistent with intertrigo. Patient will be discharged home with prescriptions for ketoconazole and hydrocortisone cream. Patient is to follow up with PCP and dermatology as directed. Resources for free and reduced cost healthcare in [facility] were given. Patient is given ED precautions to return to the ED for any worsening or new symptoms.     ____________________________________________  FINAL CLINICAL IMPRESSION(S) / ED DIAGNOSES  Final diagnoses:  Intertrigo      NEW MEDICATIONS STARTED DURING THIS VISIT:  ED Discharge Orders        Ordered    ketoconazole (NIZORAL) 2 % cream  Daily     01/24/18 1111    hydrocortisone 2.5 % cream  2 times daily     01/24/18 1111          This chart was dictated using voice recognition software/Dragon. Despite best efforts to proofread, errors can occur which can change the meaning. Any change was purely unintentional.    Enid Derry, PA-C 01/24/18 1427    Sharyn Creamer, MD 01/24/18 [phone]

## 2018-02-20 ENCOUNTER — Other Ambulatory Visit: Payer: Self-pay

## 2018-02-20 ENCOUNTER — Encounter: Payer: Self-pay | Admitting: Emergency Medicine

## 2018-02-20 ENCOUNTER — Emergency Department
Admission: EM | Admit: 2018-02-20 | Discharge: 2018-02-20 | Disposition: A | Discharge status: Home / Self Care | Payer: Medicaid Other | Attending: Student in an Organized Health Care Education/Training Program | Admitting: Student in an Organized Health Care Education/Training Program

## 2018-02-20 DIAGNOSIS — J45909 Unspecified asthma, uncomplicated: Secondary | ICD-10-CM | POA: Insufficient documentation

## 2018-02-20 DIAGNOSIS — B9789 Other viral agents as the cause of diseases classified elsewhere: Secondary | ICD-10-CM | POA: Insufficient documentation

## 2018-02-20 DIAGNOSIS — F17210 Nicotine dependence, cigarettes, uncomplicated: Secondary | ICD-10-CM | POA: Insufficient documentation

## 2018-02-20 DIAGNOSIS — J069 Acute upper respiratory infection, unspecified: Secondary | ICD-10-CM | POA: Insufficient documentation

## 2018-02-20 DIAGNOSIS — J040 Acute laryngitis: Secondary | ICD-10-CM

## 2018-02-20 MED ORDER — PSEUDOEPH-BROMPHEN-DM 30-2-10 MG/5ML PO SYRP: 5.0000 mL | ORAL_SOLUTION | Freq: Four times a day (QID) | ORAL | 0 refills | Status: DC | PRN

## 2018-02-20 MED ORDER — DEXAMETHASONE SODIUM PHOSPHATE 10 MG/ML IJ SOLN
10.0000 mg | Freq: Once | INTRAMUSCULAR | Status: AC
  Administered 2018-02-20: 10 mg via INTRAMUSCULAR
  Filled 2018-02-20: qty 1

## 2018-02-20 MED ORDER — DIPHENHYDRAMINE HCL 12.5 MG/5ML PO ELIX
12.5000 mg | ORAL_SOLUTION | Freq: Once | ORAL | Status: AC
  Administered 2018-02-20: 12.5 mg via ORAL
  Filled 2018-02-20: qty 5

## 2018-02-20 MED ORDER — LIDOCAINE VISCOUS 2 % MT SOLN
15.0000 mL | Freq: Once | OROMUCOSAL | Status: AC
  Administered 2018-02-20: 15 mL via OROMUCOSAL
  Filled 2018-02-20: qty 15

## 2018-02-20 MED ORDER — METHYLPREDNISOLONE 4 MG PO TBPK: ORAL_TABLET | ORAL | 0 refills | Status: DC

## 2018-02-20 MED ORDER — LIDOCAINE VISCOUS 2 % MT SOLN: 5.0000 mL | Freq: Four times a day (QID) | OROMUCOSAL | 0 refills | Status: DC | PRN

## 2018-02-20 NOTE — ED Triage Notes (Signed)
Sore throat and cough x 3 days

## 2018-02-20 NOTE — ED Notes (Signed)
See triage note  Presents with cough,sinus drainage and hoarseness   States sx's started about 3 days ago  Denies any fever

## 2018-02-20 NOTE — ED Provider Notes (Signed)
[facility] Emergency Department Provider Note   ____________________________________________   First MD Initiated Contact with Patient 02/20/18 1700     (approximate)  I have reviewed the triage vital signs and the nursing notes.   HISTORY  Chief Complaint Sore Throat    HPI Cindy Moon is a 47 y.o. female patient presents with sore throat, cough, and decreased voice volume for 3 days.  Patient denies fever or chills associated with this complaint.  Patient states she has cough which is nonproductive.  Patient rates the pain as a 7/10.  Patient describes pain as "ache".  No palliative measures for complaint.  Patient stated to tolerate food and fluids with discomfort.  Past Medical History:  Diagnosis Date  . Asthma     There are no active problems to display for this patient.   History reviewed. No pertinent surgical history.  Prior to Admission medications   Medication Sig Start Date End Date Taking? Authorizing Provider  albuterol (PROVENTIL HFA;VENTOLIN HFA) 108 (90 Base) MCG/ACT inhaler Inhale 2 puffs into the lungs every 6 (six) hours as needed for wheezing or shortness of breath. 11/10/16   Darci Current, MD  azithromycin (ZITHROMAX) 250 MG tablet 2 tablets today, then 1 tablet for the next 4 days. 11/05/17   Triplett, Rulon Eisenmenger B, FNP  brompheniramine-pseudoephedrine-DM 30-2-10 MG/5ML syrup Take 5 mLs by mouth 4 (four) times daily as needed. Mixed with 5 mL of Viscous Lidocaine swish and swallow. 02/20/18   Joni Reining, PA-C  guaiFENesin-codeine 100-10 MG/5ML syrup Take 10 mLs by mouth 3 (three) times daily as needed. 11/05/17   Triplett, Cari B, FNP  hydrocortisone 2.5 % cream Apply topically 2 (two) times daily. 01/24/18   Enid Derry, PA-C  ketoconazole (NIZORAL) 2 % cream Apply 1 application topically daily. 01/24/18   Enid Derry, PA-C  lidocaine (XYLOCAINE) 2 % solution Use as directed 5 mLs in the mouth or throat every 6 (six) hours as  needed for mouth pain. Mixed with 5 mL of Bromfed-DM for swish and swallow. 02/20/18   Joni Reining, PA-C  methylPREDNISolone (MEDROL DOSEPAK) 4 MG TBPK tablet Take Tapered dose as directed 02/20/18   Joni Reining, PA-C  phenazopyridine (PYRIDIUM) 97 MG tablet Take 97 mg by mouth 3 (three) times daily as needed for pain.    [provider]    Allergies Sulfa antibiotics  No family history on file.  Social History Social History   Tobacco Use  . Smoking status: Current Some Day Smoker    Packs/day: 0.10    Types: Cigarettes  . Smokeless tobacco: Never Used  Substance Use Topics  . Alcohol use: Yes  . Drug use: No    Review of Systems Constitutional: No fever/chills Eyes: No visual changes. ENT: Sore throat and decreased voice volume. Cardiovascular: Denies chest pain. Respiratory: Denies shortness of breath. Gastrointestinal: No abdominal pain.  No nausea, no vomiting.  No diarrhea.  No constipation. Genitourinary: Negative for dysuria. Musculoskeletal: Negative for back pain. Skin: Negative for rash. Neurological: Negative for headaches, focal weakness or numbness. Allergic/Immunilogical: Sulfa antibiotics. ____________________________________________   PHYSICAL EXAM:  VITAL SIGNS: ED Triage Vitals  Enc Vitals Group     BP 02/20/18 1651 (!) 115/91     Pulse Rate 02/20/18 1651 79     Resp 02/20/18 1651 16     Temp 02/20/18 1651 98.1 F (36.7 C)     Temp Source 02/20/18 1651 Oral     SpO2 02/20/18 1651  100 %     Weight 02/20/18 1651 165 lb (74.8 kg)     Height 02/20/18 1651 5\' 10"  (1.778 m)     Head Circumference --      Peak Flow --      Pain Score 02/20/18 1650 7     Pain Loc --      Pain Edu? --      Excl. in GC? --    Constitutional: Alert and oriented. Well appearing and in no acute distress. Neck: No stridor.  Hematological/Lymphatic/Immunilogical: No cervical lymphadenopathy. Cardiovascular: Normal rate, regular rhythm. Grossly normal  heart sounds.  Good peripheral circulation. Respiratory: Normal respiratory effort.  No retractions. Lungs CTAB. Neurologic:  Normal speech and language. No gross focal neurologic deficits are appreciated. No gait instability. Skin:  Skin is warm, dry and intact. No rash noted. Psychiatric: Mood and affect are normal. Speech and behavior are normal.  ____________________________________________   LABS (all labs ordered are listed, but only abnormal results are displayed)  Labs Reviewed - No data to display ____________________________________________  EKG   ____________________________________________  RADIOLOGY  ED MD interpretation:    Official radiology report(s): No results found.  ____________________________________________   PROCEDURES  Procedure(s) performed: None  Procedures  Critical Care performed: No  ____________________________________________   INITIAL IMPRESSION / ASSESSMENT AND PLAN / ED COURSE  As part of my medical decision making, I reviewed the following data within the electronic MEDICAL RECORD NUMBER    Patient presents with sore throat, decreased voice volume, and cough for 3 days.  Patient given discharge care instruction advised take medication as directed.      ____________________________________________   FINAL CLINICAL IMPRESSION(S) / ED DIAGNOSES  Final diagnoses:  Laryngitis  Viral URI with cough     ED Discharge Orders        Ordered    methylPREDNISolone (MEDROL DOSEPAK) 4 MG TBPK tablet     02/20/18 1718    brompheniramine-pseudoephedrine-DM 30-2-10 MG/5ML syrup  4 times daily PRN     02/20/18 1718    lidocaine (XYLOCAINE) 2 % solution  Every 6 hours PRN     02/20/18 1718       Note:  This document was prepared using Dragon voice recognition software and may include unintentional dictation errors.    Joni ReiningSmith, Ronald K, PA-C 02/20/18 1731    Willy Eddyobinson, Patrick, MD 02/20/18 [phone]

## 2018-04-28 ENCOUNTER — Encounter: Payer: Self-pay | Admitting: Emergency Medicine

## 2018-04-28 ENCOUNTER — Emergency Department
Admission: EM | Admit: 2018-04-28 | Discharge: 2018-04-28 | Disposition: A | Discharge status: Home / Self Care | Payer: Medicaid Other | Attending: Emergency Medicine | Admitting: Emergency Medicine

## 2018-04-28 ENCOUNTER — Other Ambulatory Visit: Payer: Self-pay

## 2018-04-28 DIAGNOSIS — N390 Urinary tract infection, site not specified: Secondary | ICD-10-CM

## 2018-04-28 DIAGNOSIS — J45909 Unspecified asthma, uncomplicated: Secondary | ICD-10-CM | POA: Insufficient documentation

## 2018-04-28 DIAGNOSIS — F17210 Nicotine dependence, cigarettes, uncomplicated: Secondary | ICD-10-CM | POA: Insufficient documentation

## 2018-04-28 LAB — URINALYSIS, COMPLETE (UACMP) WITH MICROSCOPIC
BILIRUBIN URINE: NEGATIVE
GLUCOSE, UA: NEGATIVE mg/dL
KETONES UR: NEGATIVE mg/dL
Nitrite: NEGATIVE
Protein, ur: NEGATIVE mg/dL
Specific Gravity, Urine: 1.008 (ref 1.005–1.030)
pH: 6 (ref 5.0–8.0)

## 2018-04-28 MED ORDER — CEPHALEXIN 500 MG PO CAPS: 500.0000 mg | ORAL_CAPSULE | Freq: Four times a day (QID) | ORAL | 0 refills | Status: AC

## 2018-04-28 MED ORDER — PHENAZOPYRIDINE HCL 200 MG PO TABS: 200.0000 mg | ORAL_TABLET | Freq: Three times a day (TID) | ORAL | 0 refills | Status: AC | PRN

## 2018-04-28 NOTE — ED Notes (Addendum)
Awaiting urine sample.  Pt has cup and is aware of need of sample

## 2018-04-28 NOTE — ED Provider Notes (Signed)
[facility] Emergency Department Provider Note  ____________________________________________   First MD Initiated Contact with Patient 04/28/18 2317     (approximate)  I have reviewed the triage vital signs and the nursing notes.   HISTORY  Chief Complaint Dysuria and Fall   HPI Cindy Moon is a 47 y.o. female who self presents to the emergency department with roughly 2 weeks of daily dysuria frequency and hesitancy.  No fevers or chills.  No back pain.   No flank pain.  No hematuria.  She denies vaginal discharge.   Past Medical History:  Diagnosis Date  . Asthma     There are no active problems to display for this patient.   Past Surgical History:  Procedure Laterality Date  . tubal ligation      Prior to Admission medications   Medication Sig Start Date End Date Taking? Authorizing Provider  albuterol (PROVENTIL HFA;VENTOLIN HFA) 108 (90 Base) MCG/ACT inhaler Inhale 2 puffs into the lungs every 6 (six) hours as needed for wheezing or shortness of breath. 11/10/16   Darci CurrentBrown, [place] N, MD  azithromycin (ZITHROMAX) 250 MG tablet 2 tablets today, then 1 tablet for the next 4 days. 11/05/17   Triplett, Rulon Eisenmengerari B, FNP  brompheniramine-pseudoephedrine-DM 30-2-10 MG/5ML syrup Take 5 mLs by mouth 4 (four) times daily as needed. Mixed with 5 mL of Viscous Lidocaine swish and swallow. 02/20/18   Joni ReiningSmith, Ronald K, PA-C  cephALEXin (KEFLEX) 500 MG capsule Take 1 capsule (500 mg total) by mouth 4 (four) times daily for 5 days. 04/28/18 05/03/18  Merrily Brittleifenbark, Felice Hope, MD  guaiFENesin-codeine 100-10 MG/5ML syrup Take 10 mLs by mouth 3 (three) times daily as needed. 11/05/17   Triplett, Cari B, FNP  hydrocortisone 2.5 % cream Apply topically 2 (two) times daily. 01/24/18   Enid DerryWagner, Ashley, PA-C  ketoconazole (NIZORAL) 2 % cream Apply 1 application topically daily. 01/24/18   Enid DerryWagner, Ashley, PA-C  lidocaine (XYLOCAINE) 2 % solution Use as directed 5 mLs in the mouth or throat every 6  (six) hours as needed for mouth pain. Mixed with 5 mL of Bromfed-DM for swish and swallow. 02/20/18   Joni ReiningSmith, Ronald K, PA-C  methylPREDNISolone (MEDROL DOSEPAK) 4 MG TBPK tablet Take Tapered dose as directed 02/20/18   Joni ReiningSmith, Ronald K, PA-C  phenazopyridine (PYRIDIUM) 200 MG tablet Take 1 tablet (200 mg total) by mouth 3 (three) times daily as needed for pain. 04/28/18 04/28/19  Merrily Brittleifenbark, Meeghan Skipper, MD    Allergies Sulfa antibiotics  No family history on file.  Social History Social History   Tobacco Use  . Smoking status: Current Some Day Smoker    Packs/day: 0.10    Types: Cigarettes  . Smokeless tobacco: Never Used  Substance Use Topics  . Alcohol use: Yes  . Drug use: No    Review of Systems Constitutional: No fever/chills Cardiovascular: Denies chest pain. Respiratory: Denies shortness of breath. Gastrointestinal: No abdominal pain.  No nausea, no vomiting.   Genitourinary: Positive for dysuria. Musculoskeletal: Negative for back pain. Skin: Negative for rash. Neurological: Negative for headaches   ____________________________________________   PHYSICAL EXAM:  VITAL SIGNS: ED Triage Vitals  Enc Vitals Group     BP 04/28/18 2204 135/87     Pulse Rate 04/28/18 2204 75     Resp 04/28/18 2204 18     Temp 04/28/18 2204 98 F (36.7 C)     Temp Source 04/28/18 2204 Oral     SpO2 04/28/18 2204 100 %  Weight 04/28/18 2205 165 lb (74.8 kg)     Height 04/28/18 2205 5\' 10"  (1.778 m)     Head Circumference --      Peak Flow --      Pain Score 04/28/18 2205 9     Pain Loc --      Pain Edu? --      Excl. in GC? --     Constitutional: Alert and oriented x4 well-appearing nontoxic no diaphoresis speaks in full clear sentences joking and laughing Neck: No stridor.   Cardiovascular: Normal rate, regular rhythm.  Respiratory: Normal respiratory effort.  No retractions Gastrointestinal: Soft nontender no peritonitis no costovertebral tenderness Musculoskeletal: No lower  extremity edema   Neurologic:  Normal speech and language. No gross focal neurologic deficits are appreciated. Skin:  Skin is warm, dry and intact. No rash noted. Psychiatric: Mood and affect are normal. Speech and behavior are normal.    ____________________________________________   DIFFERENTIAL includes but not limited to  UTI, interstitial cystitis, pyelonephritis ____________________________________________   LABS (all labs ordered are listed, but only abnormal results are displayed)  Labs Reviewed  URINALYSIS, COMPLETE (UACMP) WITH MICROSCOPIC - Abnormal; Notable for the following components:      Result Value   Color, Urine STRAW (*)    APPearance HAZY (*)    Hgb urine dipstick SMALL (*)    Leukocytes, UA MODERATE (*)    Bacteria, UA RARE (*)    All other components within normal limits    Urinalysis reviewed by me concerning for lower urinary tract infection __________________________________________  EKG   ____________________________________________  RADIOLOGY   ____________________________________________   PROCEDURES  Procedure(s) performed: no  Procedures  Critical Care performed: no  ____________________________________________   INITIAL IMPRESSION / ASSESSMENT AND PLAN / ED COURSE  Pertinent labs & imaging results that were available during my care of the patient were reviewed by me and considered in my medical decision making (see chart for details).   The patient's symptoms are consistent with lower urinary tract disease.  I will treat her with 5 days of cephalexin and refer her back to primary care.  No indication for IV antibiotics or culture at this time.  Strict return precautions have been given.      ____________________________________________   FINAL CLINICAL IMPRESSION(S) / ED DIAGNOSES  Final diagnoses:  Lower urinary tract infectious disease      NEW MEDICATIONS STARTED DURING THIS VISIT:  Discharge Medication List  as of 04/28/2018 11:18 PM    START taking these medications   Details  cephALEXin (KEFLEX) 500 MG capsule Take 1 capsule (500 mg total) by mouth 4 (four) times daily for 5 days., Starting Tue 04/28/2018, Until Sun 05/03/2018, Print         Note:  This document was prepared using Dragon voice recognition software and may include unintentional dictation errors.     Merrily Brittle, MD 04/30/18 0500

## 2018-04-28 NOTE — ED Triage Notes (Signed)
Patient ambulatory to triage with steady gait, without difficulty or distress noted; pt reports possible UTI, c/o dysuria x 2wks; also st slipped on tomato at work today and fell hitting buttocks; denies desire to file workers comp

## 2018-04-28 NOTE — Discharge Instructions (Signed)
It was a pleasure to take care of you today, and thank you for coming to our emergency department.  If you have any questions or concerns before leaving please ask the nurse to grab me and I'm more than happy to go through your aftercare instructions again. ° °If you were prescribed any opioid pain medication today such as Norco, Vicodin, Percocet, morphine, hydrocodone, or oxycodone please make sure you do not drive when you are taking this medication as it can alter your ability to drive safely. ° °If you have any concerns once you are home that you are not improving or are in fact getting worse before you can make it to your follow-up appointment, please do not hesitate to call 911 and come back for further evaluation. ° °Clarity Ciszek, MD ° ° ° °

## 2018-04-28 NOTE — ED Notes (Signed)

## 2018-11-15 ENCOUNTER — Emergency Department
Admission: EM | Admit: 2018-11-15 | Discharge: 2018-11-15 | Disposition: A | Discharge status: Home / Self Care | Payer: Medicaid Other | Attending: Emergency Medicine | Admitting: Emergency Medicine

## 2018-11-15 ENCOUNTER — Other Ambulatory Visit: Payer: Self-pay

## 2018-11-15 ENCOUNTER — Emergency Department: Payer: Medicaid Other

## 2018-11-15 ENCOUNTER — Encounter: Payer: Self-pay | Admitting: Emergency Medicine

## 2018-11-15 DIAGNOSIS — J45909 Unspecified asthma, uncomplicated: Secondary | ICD-10-CM | POA: Insufficient documentation

## 2018-11-15 DIAGNOSIS — S86911A Strain of unspecified muscle(s) and tendon(s) at lower leg level, right leg, initial encounter: Secondary | ICD-10-CM

## 2018-11-15 DIAGNOSIS — Y939 Activity, unspecified: Secondary | ICD-10-CM | POA: Insufficient documentation

## 2018-11-15 DIAGNOSIS — Y998 Other external cause status: Secondary | ICD-10-CM | POA: Insufficient documentation

## 2018-11-15 DIAGNOSIS — S8391XA Sprain of unspecified site of right knee, initial encounter: Secondary | ICD-10-CM | POA: Insufficient documentation

## 2018-11-15 DIAGNOSIS — F17210 Nicotine dependence, cigarettes, uncomplicated: Secondary | ICD-10-CM | POA: Insufficient documentation

## 2018-11-15 DIAGNOSIS — Y929 Unspecified place or not applicable: Secondary | ICD-10-CM | POA: Insufficient documentation

## 2018-11-15 DIAGNOSIS — W208XXA Other cause of strike by thrown, projected or falling object, initial encounter: Secondary | ICD-10-CM | POA: Insufficient documentation

## 2018-11-15 MED ORDER — MELOXICAM 15 MG PO TABS: 15.0000 mg | ORAL_TABLET | Freq: Every day | ORAL | 0 refills | Status: DC

## 2018-11-15 NOTE — ED Notes (Signed)
Topaz pad not working in room. Pt verbally understood discharge instructions and follow up care.

## 2018-11-15 NOTE — ED Provider Notes (Signed)
[facility] Emergency Department Provider Note  ____________________________________________  Time seen: Approximately 8:41 PM  I have reviewed the triage vital signs and the nursing notes.   HISTORY  Chief Complaint Knee Pain    HPI Cindy Moon is a 48 y.o. female who presents emergency department complaining of right knee pain.  Patient reports that she had dropped items on her knee and cause some slight pain.  Patient reports that she then further slipped on her knee causing increased pain to the knee.  No history of previous knee surgeries or injuries.  Patient has been using Motrin, ice, knee immobilizer.  She reports that this helped symptoms but is not fully alleviated all symptoms.  She is concerned that she may have torn something.    Past Medical History:  Diagnosis Date  . Asthma     There are no active problems to display for this patient.   Past Surgical History:  Procedure Laterality Date  . tubal ligation      Prior to Admission medications   Medication Sig Start Date End Date Taking? Authorizing Provider  albuterol (PROVENTIL HFA;VENTOLIN HFA) 108 (90 Base) MCG/ACT inhaler Inhale 2 puffs into the lungs every 6 (six) hours as needed for wheezing or shortness of breath. 11/10/16   Darci Current, MD  azithromycin (ZITHROMAX) 250 MG tablet 2 tablets today, then 1 tablet for the next 4 days. 11/05/17   Triplett, Rulon Eisenmenger B, FNP  brompheniramine-pseudoephedrine-DM 30-2-10 MG/5ML syrup Take 5 mLs by mouth 4 (four) times daily as needed. Mixed with 5 mL of Viscous Lidocaine swish and swallow. 02/20/18   Joni Reining, PA-C  guaiFENesin-codeine 100-10 MG/5ML syrup Take 10 mLs by mouth 3 (three) times daily as needed. 11/05/17   Triplett, Cari B, FNP  hydrocortisone 2.5 % cream Apply topically 2 (two) times daily. 01/24/18   Enid Derry, PA-C  ketoconazole (NIZORAL) 2 % cream Apply 1 application topically daily. 01/24/18   Enid Derry, PA-C   lidocaine (XYLOCAINE) 2 % solution Use as directed 5 mLs in the mouth or throat every 6 (six) hours as needed for mouth pain. Mixed with 5 mL of Bromfed-DM for swish and swallow. 02/20/18   Joni Reining, PA-C  meloxicam (MOBIC) 15 MG tablet Take 1 tablet (15 mg total) by mouth daily. 11/15/18   Cuthriell, Delorise Royals, PA-C  methylPREDNISolone (MEDROL DOSEPAK) 4 MG TBPK tablet Take Tapered dose as directed 02/20/18   Joni Reining, PA-C  phenazopyridine (PYRIDIUM) 200 MG tablet Take 1 tablet (200 mg total) by mouth 3 (three) times daily as needed for pain. 04/28/18 04/28/19  Merrily Brittle, MD    Allergies Sulfa antibiotics  No family history on file.  Social History Social History   Tobacco Use  . Smoking status: Current Some Day Smoker    Packs/day: 0.10    Types: Cigarettes  . Smokeless tobacco: Never Used  Substance Use Topics  . Alcohol use: Yes  . Drug use: No     Review of Systems  Constitutional: No fever/chills Eyes: No visual changes.  Cardiovascular: no chest pain. Respiratory: no cough. No SOB. Gastrointestinal: No abdominal pain.  No nausea, no vomiting.  Musculoskeletal: Positive for right knee pain/injury Skin: Negative for rash, abrasions, lacerations, ecchymosis. Neurological: Negative for headaches, focal weakness or numbness. 10-point ROS otherwise negative.  ____________________________________________   PHYSICAL EXAM:  VITAL SIGNS: ED Triage Vitals  Enc Vitals Group     BP 11/15/18 1944 (!) 148/102  Pulse Rate 11/15/18 1944 80     Resp 11/15/18 1944 18     Temp 11/15/18 1944 97.7 F (36.5 C)     Temp Source 11/15/18 1944 Oral     SpO2 11/15/18 1944 100 %     Weight 11/15/18 1943 151 lb (68.5 kg)     Height 11/15/18 1943 5\' 10"  (1.778 m)     Head Circumference --      Peak Flow --      Pain Score 11/15/18 1943 9     Pain Loc --      Pain Edu? --      Excl. in GC? --      Constitutional: Alert and oriented. Well appearing and in no  acute distress. Eyes: Conjunctivae are normal. PERRL. EOMI. Head: Atraumatic. Neck: No stridor.    Cardiovascular: Normal rate, regular rhythm. Normal S1 and S2.  Good peripheral circulation. Respiratory: Normal respiratory effort without tachypnea or retractions. Lungs CTAB. Good air entry to the bases with no decreased or absent breath sounds. Musculoskeletal: Full range of motion to all extremities. No gross deformities appreciated.  Visualization of the right knee reveals no visible edema, ecchymosis, abrasions or lacerations.  Patient is able to extend and flex the knee appropriately.  She is able to ambulate on the knee.  Varus, valgus, Lachman's, McMurray's is negative.  No specific point tenderness.  No palpable abnormality.  No ballottement.  Dorsalis pedis pulse intact distally.  Sensation intact distally. Neurologic:  Normal speech and language. No gross focal neurologic deficits are appreciated.  Skin:  Skin is warm, dry and intact. No rash noted. Psychiatric: Mood and affect are normal. Speech and behavior are normal. Patient exhibits appropriate insight and judgement.   ____________________________________________   LABS (all labs ordered are listed, but only abnormal results are displayed)  Labs Reviewed - No data to display ____________________________________________  EKG   ____________________________________________  RADIOLOGY I personally viewed and evaluated these images as part of my medical decision making, as well as reviewing the written report by the radiologist.  I concur with radiologist finding of no acute osseous abnormality to the right knee.  Dg Knee Complete 4 Views Right  Result Date: 11/15/2018 CLINICAL DATA:  48 year old female with right knee pain. EXAM: RIGHT KNEE - COMPLETE 4+ VIEW COMPARISON:  None. FINDINGS: There is no acute fracture or dislocation. The bones are well mineralized. There is a partially visualized area of cortical thickening and  sclerotic changes involving the distal third of the femoral diaphysis. There is no associated cortical breakage or other radiographic aggressive features. Further evaluation with dedicated radiograph of the femur or MRI on a nonemergent basis recommended. No joint effusion. The soft tissues are unremarkable. IMPRESSION: 1. No acute fracture or dislocation. 2. Partially visualized area of cortical thickening in the distal femur. Further evaluation with dedicated radiograph or MRI on a nonemergent basis recommended. Electronically Signed   By: Elgie CollardArash  Radparvar M.D.   On: 11/15/2018 20:47    ____________________________________________    PROCEDURES  Procedure(s) performed:    Procedures    Medications - No data to display   ____________________________________________   INITIAL IMPRESSION / ASSESSMENT AND PLAN / ED COURSE  Pertinent labs & imaging results that were available during my care of the patient were reviewed by me and considered in my medical decision making (see chart for details).  Review of the [place] CSRS was performed in accordance of the [facility] prior to dispensing any controlled drugs.  Patient's diagnosis is consistent with right knee strain.  Patient presents emergency department complaining of right knee pain.  Exam was overall reassuring with no indication of acute ligamentous rupture.  X-ray reveals no acute osseous abnormality.  Meloxicam for symptom improvement.  Follow-up with primary care or orthopedics as needed..  Patient is given ED precautions to return to the ED for any worsening or new symptoms.     ____________________________________________  FINAL CLINICAL IMPRESSION(S) / ED DIAGNOSES  Final diagnoses:  Strain of right knee, initial encounter      NEW MEDICATIONS STARTED DURING THIS VISIT:  ED Discharge Orders         Ordered    meloxicam (MOBIC) 15 MG tablet  Daily     11/15/18 2114              This chart was dictated using  voice recognition software/Dragon. Despite best efforts to proofread, errors can occur which can change the meaning. Any change was purely unintentional.    Racheal Patches, PA-C 11/15/18 2115    Jeanmarie Plant, MD 11/15/18 [phone]

## 2018-11-15 NOTE — ED Triage Notes (Signed)
Pt arrives POV to triage with c/o right knee pain. Pt was trying to catch a cart at [employer] Pt states that this happened on Thursday. Pt is in NAD.

## 2019-01-19 ENCOUNTER — Other Ambulatory Visit: Payer: Self-pay

## 2019-01-19 ENCOUNTER — Emergency Department
Admission: EM | Admit: 2019-01-19 | Discharge: 2019-01-19 | Disposition: A | Discharge status: Home / Self Care | Payer: Medicaid Other | Attending: Emergency Medicine | Admitting: Emergency Medicine

## 2019-01-19 DIAGNOSIS — F17210 Nicotine dependence, cigarettes, uncomplicated: Secondary | ICD-10-CM | POA: Insufficient documentation

## 2019-01-19 DIAGNOSIS — J45909 Unspecified asthma, uncomplicated: Secondary | ICD-10-CM | POA: Insufficient documentation

## 2019-01-19 DIAGNOSIS — J069 Acute upper respiratory infection, unspecified: Secondary | ICD-10-CM

## 2019-01-19 DIAGNOSIS — R059 Cough, unspecified: Secondary | ICD-10-CM

## 2019-01-19 DIAGNOSIS — Z79899 Other long term (current) drug therapy: Secondary | ICD-10-CM | POA: Insufficient documentation

## 2019-01-19 DIAGNOSIS — R05 Cough: Secondary | ICD-10-CM | POA: Insufficient documentation

## 2019-01-19 MED ORDER — GUAIFENESIN-CODEINE 100-10 MG/5ML PO SOLN: 5.0000 mL | Freq: Four times a day (QID) | ORAL | 0 refills | Status: DC | PRN

## 2019-01-19 NOTE — ED Provider Notes (Signed)
[facility] Emergency Department Provider Note  Time seen: 9:47 PM  I have reviewed the triage vital signs and the nursing notes.   HISTORY  Chief Complaint Cough    HPI Cindy Moon is a 48 y.o. female with a past medical history of asthma presents to the emergency department for cough and congestion.  According to the patient over the past 1 to 2 weeks patient has had largely dry cough but does state occasional white sputum production.  States she has been having congestion with a mild sore throat with moderate nasal congestion.  Patient denies any known fever at any point.  Patient states she works at [employer] has a lot of patient contact and was concerned, and her employer was concerned so sent her to the emergency department for evaluation.  Here patient is afebrile, reassuring vitals.   Past Medical History:  Diagnosis Date  . Asthma     There are no active problems to display for this patient.   Past Surgical History:  Procedure Laterality Date  . tubal ligation      Prior to Admission medications   Medication Sig Start Date End Date Taking? Authorizing Provider  albuterol (PROVENTIL HFA;VENTOLIN HFA) 108 (90 Base) MCG/ACT inhaler Inhale 2 puffs into the lungs every 6 (six) hours as needed for wheezing or shortness of breath. 11/10/16   Darci Current, MD  azithromycin (ZITHROMAX) 250 MG tablet 2 tablets today, then 1 tablet for the next 4 days. 11/05/17   Triplett, Rulon Eisenmenger B, FNP  brompheniramine-pseudoephedrine-DM 30-2-10 MG/5ML syrup Take 5 mLs by mouth 4 (four) times daily as needed. Mixed with 5 mL of Viscous Lidocaine swish and swallow. 02/20/18   Joni Reining, PA-C  guaiFENesin-codeine 100-10 MG/5ML syrup Take 10 mLs by mouth 3 (three) times daily as needed. 11/05/17   Triplett, Cari B, FNP  hydrocortisone 2.5 % cream Apply topically 2 (two) times daily. 01/24/18   Enid Derry, PA-C  ketoconazole (NIZORAL) 2 % cream Apply 1 application topically  daily. 01/24/18   Enid Derry, PA-C  lidocaine (XYLOCAINE) 2 % solution Use as directed 5 mLs in the mouth or throat every 6 (six) hours as needed for mouth pain. Mixed with 5 mL of Bromfed-DM for swish and swallow. 02/20/18   Joni Reining, PA-C  meloxicam (MOBIC) 15 MG tablet Take 1 tablet (15 mg total) by mouth daily. 11/15/18   Cuthriell, Delorise Royals, PA-C  methylPREDNISolone (MEDROL DOSEPAK) 4 MG TBPK tablet Take Tapered dose as directed 02/20/18   Joni Reining, PA-C  phenazopyridine (PYRIDIUM) 200 MG tablet Take 1 tablet (200 mg total) by mouth 3 (three) times daily as needed for pain. 04/28/18 04/28/19  Merrily Brittle, MD    Allergies  Allergen Reactions  . Sulfa Antibiotics     No family history on file.  Social History Social History   Tobacco Use  . Smoking status: Current Some Day Smoker    Packs/day: 0.10    Types: Cigarettes  . Smokeless tobacco: Never Used  Substance Use Topics  . Alcohol use: Yes  . Drug use: No    Review of Systems Constitutional: Negative for fever. ENT: Positive for sore throat. Cardiovascular: Negative for chest pain. Respiratory: Negative for shortness of breath.  Positive for cough. Gastrointestinal: Negative for abdominal pain, vomiting  Musculoskeletal: Negative for musculoskeletal complaints Skin: Negative for skin complaints  Neurological: Negative for headache All other ROS negative  ____________________________________________   PHYSICAL EXAM:  VITAL SIGNS: ED Triage  Vitals  Enc Vitals Group     BP 01/19/19 2140 (!) 118/91     Pulse Rate 01/19/19 2140 85     Resp 01/19/19 2140 18     Temp 01/19/19 2140 98.3 F (36.8 C)     Temp src --      SpO2 01/19/19 2140 100 %     Weight 01/19/19 2113 155 lb (70.3 kg)     Height 01/19/19 2113 5\' 10"  (1.778 m)     Head Circumference --      Peak Flow --      Pain Score 01/19/19 2113 0     Pain Loc --      Pain Edu? --      Excl. in GC? --     Constitutional: Alert and  oriented. Well appearing and in no distress. Eyes: Normal exam ENT   Head: Normocephalic and atraumatic.   Nose: Mild congestion.   Mouth/Throat: Mucous membranes are moist.  Mild pharyngeal erythema without exudate or hypertrophy. Cardiovascular: Normal rate, regular rhythm. No murmur Respiratory: Normal respiratory effort without tachypnea nor retractions. Breath sounds are clear  Gastrointestinal: Soft and nontender. No distention. Musculoskeletal: Nontender with normal range of motion in all extremities.  Neurologic:  Normal speech and language. No gross focal neurologic deficits  Skin:  Skin is warm, dry and intact.  Psychiatric: Mood and affect are normal.   ____________________________________________    INITIAL IMPRESSION / ASSESSMENT AND PLAN / ED COURSE  Pertinent labs & imaging results that were available during my care of the patient were reviewed by me and considered in my medical decision making (see chart for details).  Patient presents to the emergency department for cough nasal congestion over the last 1 week.  Upon arrival patient appears well she does appear like she has an upper respiratory infection, but is not febrile reassuring vitals and clear lung sounds.  Suspect likely viral upper respiratory infection.  I discussed return precautions.  Patient agreeable plan of care.  ____________________________________________   FINAL CLINICAL IMPRESSION(S) / ED DIAGNOSES  Upper respiratory infection   Minna Antis, MD 01/19/19 2150

## 2019-01-19 NOTE — ED Triage Notes (Signed)
Pt in with co cough and sore throat for 2 weeks, job wants clearance to go back to work.

## 2019-02-14 ENCOUNTER — Other Ambulatory Visit: Payer: Self-pay

## 2019-02-14 ENCOUNTER — Emergency Department: Payer: Medicaid Other

## 2019-02-14 ENCOUNTER — Emergency Department
Admission: EM | Admit: 2019-02-14 | Discharge: 2019-02-14 | Disposition: A | Discharge status: Home / Self Care | Payer: Medicaid Other | Attending: Emergency Medicine | Admitting: Emergency Medicine

## 2019-02-14 DIAGNOSIS — J45909 Unspecified asthma, uncomplicated: Secondary | ICD-10-CM | POA: Insufficient documentation

## 2019-02-14 DIAGNOSIS — R059 Cough, unspecified: Secondary | ICD-10-CM

## 2019-02-14 DIAGNOSIS — R05 Cough: Secondary | ICD-10-CM | POA: Insufficient documentation

## 2019-02-14 DIAGNOSIS — F17210 Nicotine dependence, cigarettes, uncomplicated: Secondary | ICD-10-CM | POA: Insufficient documentation

## 2019-02-14 MED ORDER — AZITHROMYCIN 250 MG PO TABS: ORAL_TABLET | ORAL | 0 refills | Status: DC

## 2019-02-14 MED ORDER — HYDROCOD POLST-CPM POLST ER 10-8 MG/5ML PO SUER: 5.0000 mL | Freq: Two times a day (BID) | ORAL | 0 refills | Status: DC

## 2019-02-14 MED ORDER — PREDNISONE 10 MG (21) PO TBPK: ORAL_TABLET | ORAL | 0 refills | Status: DC

## 2019-02-14 NOTE — ED Notes (Signed)
Pt walking back into room stating that she had needed a soda and could not wait. MD Mayford Knife notified of pt's return.

## 2019-02-14 NOTE — ED Triage Notes (Signed)
Patient reports continued cough and sore throat.  States was seen the end of March for same symptoms and given cough medicine.  Reports her boss told her to come get antibiotics.

## 2019-02-14 NOTE — ED Notes (Signed)
Pt not in room at this time. MD Mayford Knife made aware that pt left.

## 2019-02-14 NOTE — ED Provider Notes (Addendum)
[facility] Emergency Department Provider Note       Time seen: ----------------------------------------- 8:29 PM on 02/14/2019 -----------------------------------------   I have reviewed the triage vital signs and the nursing notes.  HISTORY   Chief Complaint Sore Throat and Cough    HPI Cindy Moon is a 48 y.o. female with a history of asthma who presents to the ED for cough and sore throat.  Patient states she was seen at the end of March for the same symptoms and was given cough medicine.  She reports her boss told her to come get antibiotics.  She denies any vomiting or diarrhea.  Past Medical History:  Diagnosis Date  . Asthma     There are no active problems to display for this patient.   Past Surgical History:  Procedure Laterality Date  . tubal ligation      Allergies Sulfa antibiotics  Social History Social History   Tobacco Use  . Smoking status: Current Some Day Smoker    Packs/day: 0.10    Types: Cigarettes  . Smokeless tobacco: Never Used  Substance Use Topics  . Alcohol use: Yes  . Drug use: No   Review of Systems Constitutional: Negative for fever. Cardiovascular: Negative for chest pain. Respiratory: Positive for cough and congestion Gastrointestinal: Negative for abdominal pain, vomiting and diarrhea. Musculoskeletal: Negative for back pain. Skin: Negative for rash. Neurological: Negative for headaches, focal weakness or numbness.  All systems negative/normal/unremarkable except as stated in the HPI  ____________________________________________   PHYSICAL EXAM:  VITAL SIGNS: ED Triage Vitals  Enc Vitals Group     BP 02/14/19 1918 122/81     Pulse --      Resp 02/14/19 1918 17     Temp 02/14/19 1918 98.5 F (36.9 C)     Temp Source 02/14/19 1918 Oral     SpO2 02/14/19 1918 100 %     Weight --      Height --      Head Circumference --      Peak Flow --      Pain Score 02/14/19 1919 0     Pain Loc  --      Pain Edu? --      Excl. in GC? --    Constitutional: Alert and oriented. Well appearing and in no distress. Eyes: Conjunctivae are normal. Normal extraocular movements. ENT      Head: Normocephalic and atraumatic.      Nose: Congestion is noted bilaterally      Mouth/Throat: Mucous membranes are moist.      Neck: No stridor. Cardiovascular: Normal rate, regular rhythm. No murmurs, rubs, or gallops. Respiratory: Normal respiratory effort without tachypnea nor retractions. Breath sounds are clear and equal bilaterally. No wheezes/rales/rhonchi. Gastrointestinal: Soft and nontender. Normal bowel sounds Musculoskeletal: Nontender with normal range of motion in extremities. No lower extremity tenderness nor edema. Neurologic:  Normal speech and language. No gross focal neurologic deficits are appreciated.  Skin:  Skin is warm, dry and intact. No rash noted. Psychiatric: Mood and affect are normal. Speech and behavior are normal.  ____________________________________________  ED COURSE:  As part of my medical decision making, I reviewed the following data within the electronic MEDICAL RECORD NUMBER History obtained from family if available, nursing notes, old chart and ekg, as well as notes from prior ED visits. Patient presented for cough, we will assess with labs and imaging as indicated at this time.   Procedures  Cindy Moon was evaluated in  Emergency Department on 02/14/2019 for the symptoms described in the history of present illness. She was evaluated in the context of the global COVID-19 pandemic, which necessitated consideration that the patient might be at risk for infection with the SARS-CoV-2 virus that causes COVID-19. Institutional protocols and algorithms that pertain to the evaluation of patients at risk for COVID-19 are in a state of rapid change based on information released by regulatory bodies including the CDC and federal and state organizations. These policies and  algorithms were followed during the patient's care in the ED.  ____________________________________________   RADIOLOGY  Chest x-ray Does not reveal any acute process ____________________________________________   DIFFERENTIAL DIAGNOSIS   URI, sinusitis, seasonal allergy, pneumonia  FINAL ASSESSMENT AND PLAN  Cough   Plan: The patient had presented for cough and sore throat. Patient's imaging did not reveal any acute process.  She will be given antibiotics and cough medicine.  She is cleared for outpatient follow-up.   Ulice DashJohnathan E Jax Abdelrahman, MD    Note: This note was generated in part or whole with voice recognition software. Voice recognition is usually quite accurate but there are transcription errors that can and very often do occur. I apologize for any typographical errors that were not detected and corrected.     Emily FilbertWilliams, Juel Bellerose E, MD 02/14/19 2045    Emily FilbertWilliams, Sammy Cassar E, MD 02/14/19 2046

## 2019-03-11 ENCOUNTER — Emergency Department
Admission: EM | Admit: 2019-03-11 | Discharge: 2019-03-12 | Disposition: A | Discharge status: Home / Self Care | Payer: HRSA Program | Attending: Emergency Medicine | Admitting: Emergency Medicine

## 2019-03-11 ENCOUNTER — Other Ambulatory Visit: Payer: Self-pay

## 2019-03-11 ENCOUNTER — Encounter: Payer: Self-pay | Admitting: Emergency Medicine

## 2019-03-11 DIAGNOSIS — F17210 Nicotine dependence, cigarettes, uncomplicated: Secondary | ICD-10-CM | POA: Diagnosis not present

## 2019-03-11 DIAGNOSIS — Z79899 Other long term (current) drug therapy: Secondary | ICD-10-CM | POA: Diagnosis not present

## 2019-03-11 DIAGNOSIS — Z20828 Contact with and (suspected) exposure to other viral communicable diseases: Secondary | ICD-10-CM | POA: Diagnosis not present

## 2019-03-11 DIAGNOSIS — J45909 Unspecified asthma, uncomplicated: Secondary | ICD-10-CM | POA: Diagnosis not present

## 2019-03-11 DIAGNOSIS — J069 Acute upper respiratory infection, unspecified: Secondary | ICD-10-CM | POA: Diagnosis not present

## 2019-03-11 DIAGNOSIS — R05 Cough: Secondary | ICD-10-CM | POA: Diagnosis present

## 2019-03-11 NOTE — ED Triage Notes (Signed)
Patient ambulatory to triage with steady gait, without difficulty or distress noted; pt reports congestion and prod cough green sputum

## 2019-03-12 ENCOUNTER — Emergency Department: Payer: HRSA Program

## 2019-03-12 LAB — SARS CORONAVIRUS 2 BY RT PCR (HOSPITAL ORDER, PERFORMED IN ~~LOC~~ HOSPITAL LAB): SARS Coronavirus 2: NEGATIVE

## 2019-03-12 MED ORDER — AZITHROMYCIN 500 MG PO TABS
500.0000 mg | ORAL_TABLET | Freq: Once | ORAL | Status: AC
  Administered 2019-03-12: 500 mg via ORAL
  Filled 2019-03-12: qty 1

## 2019-03-12 MED ORDER — BENZONATATE 100 MG PO CAPS: 100.0000 mg | ORAL_CAPSULE | Freq: Three times a day (TID) | ORAL | 0 refills | Status: DC | PRN

## 2019-03-12 MED ORDER — BENZONATATE 100 MG PO CAPS
100.0000 mg | ORAL_CAPSULE | Freq: Once | ORAL | Status: AC
  Administered 2019-03-12: 01:00:00 100 mg via ORAL
  Filled 2019-03-12: qty 1

## 2019-03-12 MED ORDER — AZITHROMYCIN 500 MG PO TABS: 500.0000 mg | ORAL_TABLET | Freq: Every day | ORAL | 0 refills | Status: AC

## 2019-03-12 NOTE — ED Provider Notes (Signed)
[facility] Emergency Department Provider Note    First MD Initiated Contact with Patient 03/12/19 0001     (approximate)  I have reviewed the triage vital signs and the nursing notes.   HISTORY  Chief Complaint Cough    HPI Cindy Moon is a 48 y.o. female with history of asthma and positive stress cigarette smoking daily presents to the emergency department with 1 week history of nasal congestion productive cough with green sputum.  Patient denies any fever.  Patient does admit to 1 sick contact her daughter with similar symptoms.  Patient states that while her symptoms are improving she was informed by [employer] that she had to obtain a work note to return to work which prompted her visit to the emergency department tonight.        Past Medical History:  Diagnosis Date  . Asthma     There are no active problems to display for this patient.   Past Surgical History:  Procedure Laterality Date  . tubal ligation      Prior to Admission medications   Medication Sig Start Date End Date Taking? Authorizing Provider  albuterol (PROVENTIL HFA;VENTOLIN HFA) 108 (90 Base) MCG/ACT inhaler Inhale 2 puffs into the lungs every 6 (six) hours as needed for wheezing or shortness of breath. 11/10/16   Darci Current, MD  azithromycin (ZITHROMAX Z-PAK) 250 MG tablet Take 2 tablets (500 mg) on  Day 1,  followed by 1 tablet (250 mg) once daily on Days 2 through 5. 02/14/19   Emily Filbert, MD  azithromycin (ZITHROMAX) 250 MG tablet 2 tablets today, then 1 tablet for the next 4 days. 11/05/17   Triplett, Rulon Eisenmenger B, FNP  brompheniramine-pseudoephedrine-DM 30-2-10 MG/5ML syrup Take 5 mLs by mouth 4 (four) times daily as needed. Mixed with 5 mL of Viscous Lidocaine swish and swallow. 02/20/18   Joni Reining, PA-C  chlorpheniramine-HYDROcodone (TUSSIONEX PENNKINETIC ER) 10-8 MG/5ML SUER Take 5 mLs by mouth 2 (two) times daily. 02/14/19   Emily Filbert, MD   guaiFENesin-codeine 100-10 MG/5ML syrup Take 5 mLs by mouth every 6 (six) hours as needed for cough. 01/19/19   Minna Antis, MD  hydrocortisone 2.5 % cream Apply topically 2 (two) times daily. 01/24/18   Enid Derry, PA-C  ketoconazole (NIZORAL) 2 % cream Apply 1 application topically daily. 01/24/18   Enid Derry, PA-C  lidocaine (XYLOCAINE) 2 % solution Use as directed 5 mLs in the mouth or throat every 6 (six) hours as needed for mouth pain. Mixed with 5 mL of Bromfed-DM for swish and swallow. 02/20/18   Joni Reining, PA-C  meloxicam (MOBIC) 15 MG tablet Take 1 tablet (15 mg total) by mouth daily. 11/15/18   Cuthriell, Delorise Royals, PA-C  methylPREDNISolone (MEDROL DOSEPAK) 4 MG TBPK tablet Take Tapered dose as directed 02/20/18   Joni Reining, PA-C  phenazopyridine (PYRIDIUM) 200 MG tablet Take 1 tablet (200 mg total) by mouth 3 (three) times daily as needed for pain. 04/28/18 04/28/19  Merrily Brittle, MD  predniSONE (STERAPRED UNI-PAK 21 TAB) 10 MG (21) TBPK tablet Dispense taper pack as directed 02/14/19   Emily Filbert, MD    Allergies Sulfa antibiotics  No family history on file.  Social History Social History   Tobacco Use  . Smoking status: Current Some Day Smoker    Packs/day: 0.10    Types: Cigarettes  . Smokeless tobacco: Never Used  Substance Use Topics  . Alcohol use: Yes  .  Drug use: No    Review of Systems Constitutional: No fever/chills Eyes: No visual changes. ENT: No sore throat. Cardiovascular: Denies chest pain. Respiratory: Denies shortness of breath.  Positive for cough Gastrointestinal: No abdominal pain.  No nausea, no vomiting.  No diarrhea.  No constipation. Genitourinary: Negative for dysuria. Musculoskeletal: Negative for neck pain.  Negative for back pain. Integumentary: Negative for rash. Neurological: Negative for headaches, focal weakness or numbness.  ____________________________________________   PHYSICAL EXAM:  VITAL  SIGNS: ED Triage Vitals  Enc Vitals Group     BP 03/11/19 2329 107/81     Pulse Rate 03/11/19 2329 99     Resp 03/11/19 2329 16     Temp 03/11/19 2329 97.6 F (36.4 C)     Temp Source 03/11/19 2329 Oral     SpO2 03/11/19 2329 100 %     Weight 03/11/19 2327 70.3 kg (155 lb)     Height 03/11/19 2327 1.778 m ( )     Head Circumference --      Peak Flow --      Pain Score --      Pain Loc --      Pain Edu? --      Excl. in GC? --     Constitutional: Alert and oriented. Well appearing and in no acute distress.  Coughing Eyes: Conjunctivae are normal.  Mouth/Throat: Mucous membranes are moist. Oropharynx non-erythematous. Neck: No stridor. Cardiovascular: Normal rate, regular rhythm. Good peripheral circulation. Grossly normal heart sounds. Respiratory: Normal respiratory effort.  No retractions. No audible wheezing. Gastrointestinal: Soft and nontender. No distention.  Musculoskeletal: No lower extremity tenderness nor edema. No gross deformities of extremities. Neurologic:  Normal speech and language. No gross focal neurologic deficits are appreciated.  Skin:  Skin is warm, dry and intact. No rash noted. Psychiatric: Mood and affect are normal. Speech and behavior are normal.  ____________________________________________   LABS (all labs ordered are listed, but only abnormal results are displayed)  Labs Reviewed  SARS CORONAVIRUS 2 (HOSPITAL ORDER, PERFORMED IN [facility])   ____________________________________  RADIOLOGY I, Darci Current, personally viewed and evaluated these images (plain radiographs) as part of my medical decision making, as well as reviewing the written report by the radiologist.  ED MD interpretation: Negative chest x-ray per radiologist.  Official radiology report(s): Dg Chest Portable 1 View  Result Date: 03/12/2019 CLINICAL DATA:  Cough and fever. EXAM: PORTABLE CHEST 1 VIEW COMPARISON:  02/14/2019 FINDINGS: The  cardiomediastinal contours are normal. The lungs are clear. Pulmonary vasculature is normal. No consolidation, pleural effusion, or pneumothorax. No acute osseous abnormalities are seen. IMPRESSION: No acute chest findings. Electronically Signed   By: Narda Rutherford M.D.   On: 03/12/2019 00:42     Procedures   ____________________________________________   INITIAL IMPRESSION / MDM / ASSESSMENT AND PLAN / ED COURSE  As part of my medical decision making, I reviewed the following data within the electronic MEDICAL RECORD NUMBER   48 year old female presenting with above history and physical exam concerning for URI/bronchitis/COVID 19.  COVID-19 testing negative chest x-ray also negative.  Patient given Tessalon and azithromycin in the emergency department will be prescribed the same for home.   ____________________________________________  FINAL CLINICAL IMPRESSION(S) / ED DIAGNOSES  Final diagnoses:  Acute upper respiratory infection     MEDICATIONS GIVEN DURING THIS VISIT:  Medications  benzonatate (TESSALON) capsule 100 mg (100 mg Oral Given 03/12/19 0123)  azithromycin (ZITHROMAX) tablet 500 mg (500 mg  Oral Given 03/12/19 0123)     ED Discharge Orders    None       Note:  This document was prepared using Dragon voice recognition software and may include unintentional dictation errors.   Darci CurrentBrown, [place] N, MD 03/12/19 [phone]

## 2019-03-16 IMAGING — US US TRANSVAGINAL NON-OB
1 series · 14 of 25 positions shown · non-contrast
Comparison: CT abdomen pelvis 04/06/2017

CLINICAL DATA: Left pelvic pain

EXAM:
TRANSABDOMINAL AND TRANSVAGINAL ULTRASOUND OF PELVIS
TECHNIQUE: Both transabdominal and transvaginal ultrasound examinations of the
pelvis were performed. Transabdominal technique was performed for
global imaging of the pelvis including uterus, ovaries, adnexal
regions, and pelvic cul-de-sac. It was necessary to proceed with
endovaginal exam following the transabdominal exam to visualize the
uterus and ovaries.

[Series 1: us transvaginal non-ob · 0.22mm/px · 14 of 88 slices shown]
[im 1/88]
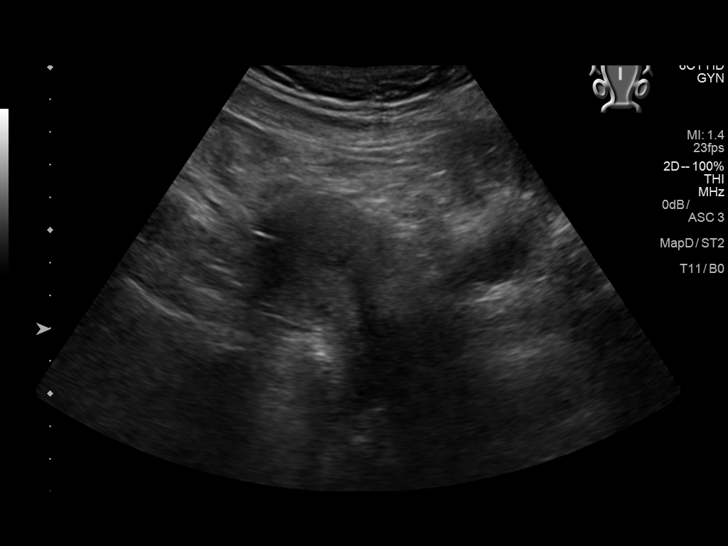
[im 8/88]
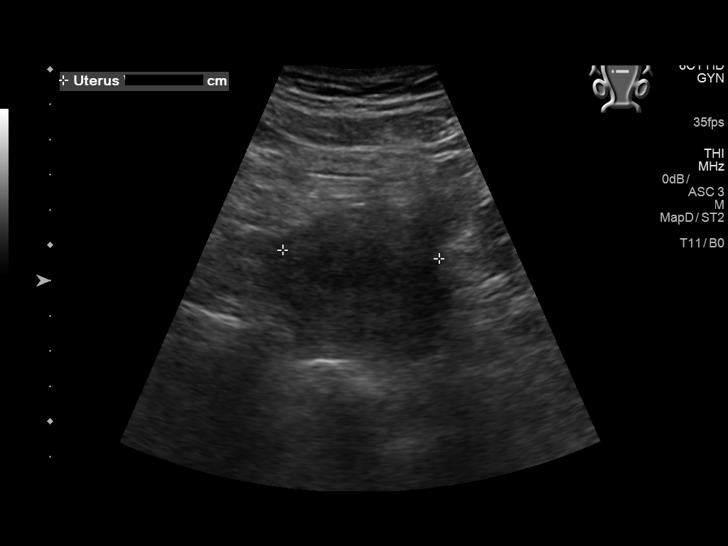
[im 15/88]
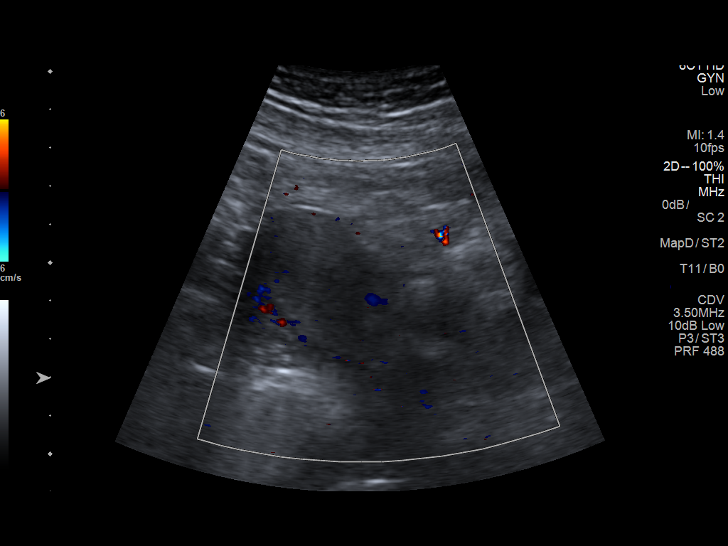
[im 22/88]
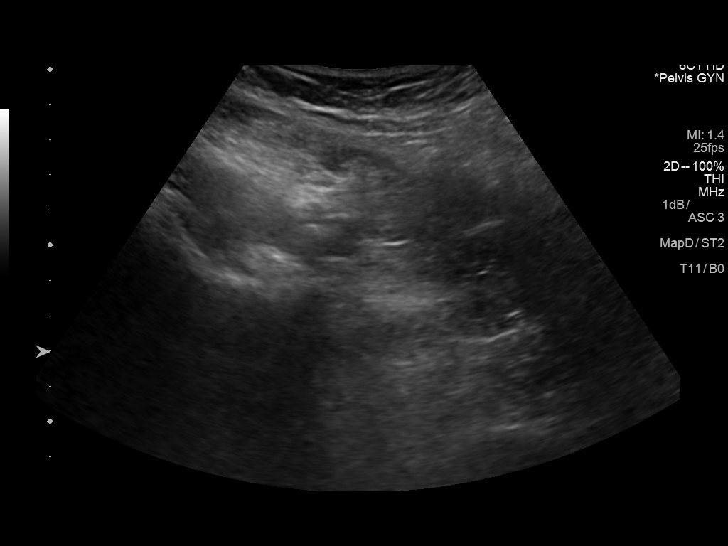
[im 30/88]
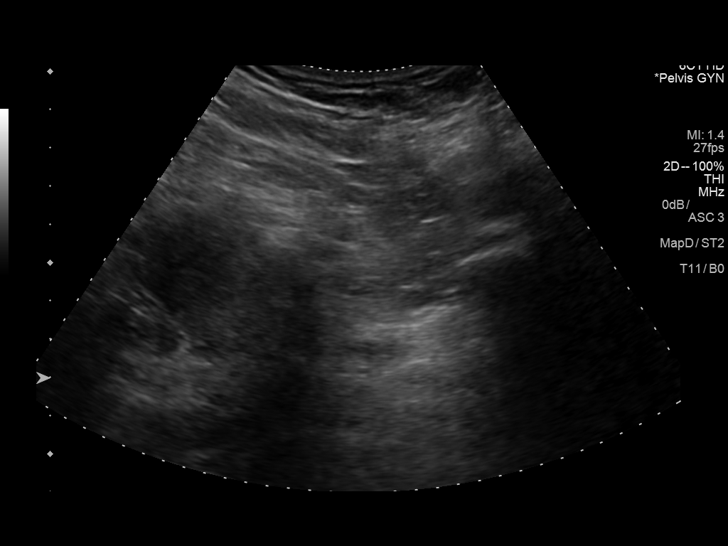
[im 33/88]
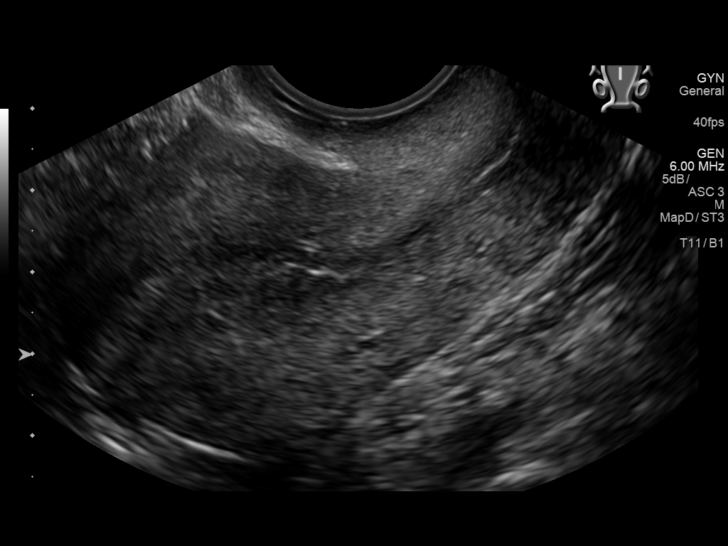
[im 40/88]
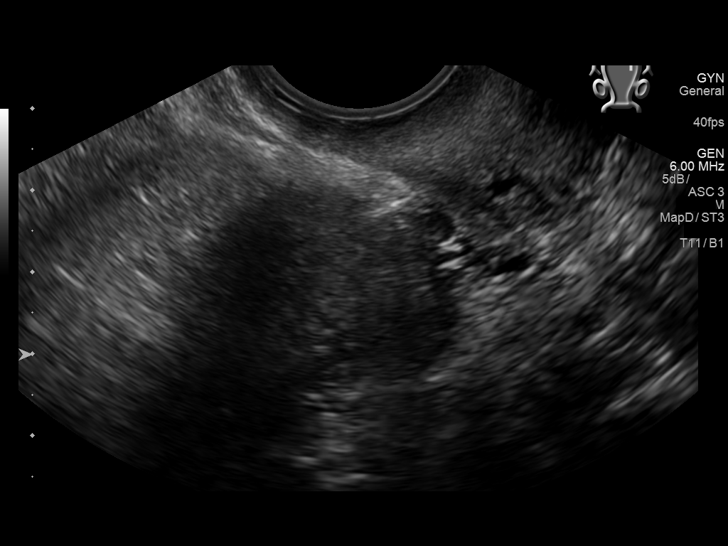
[im 48/88]
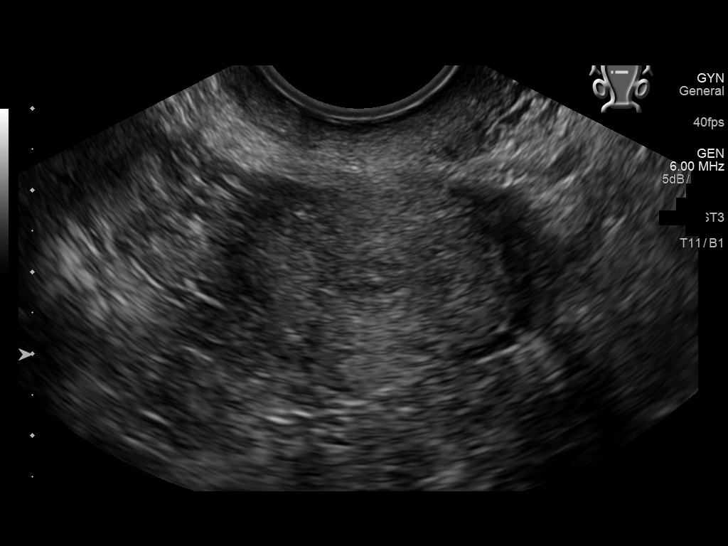
[im 55/88]
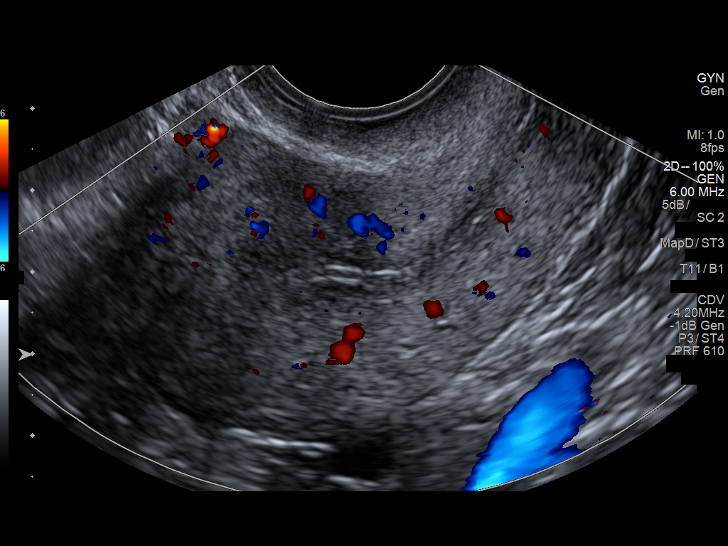
[im 59/88]
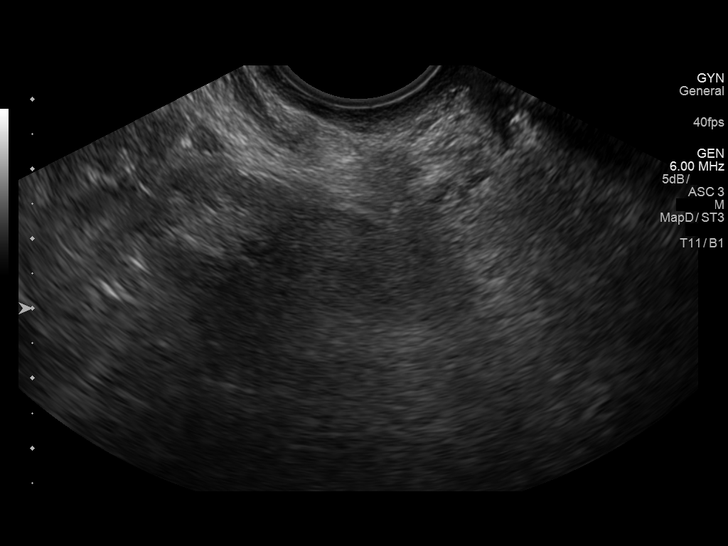
[im 66/88]
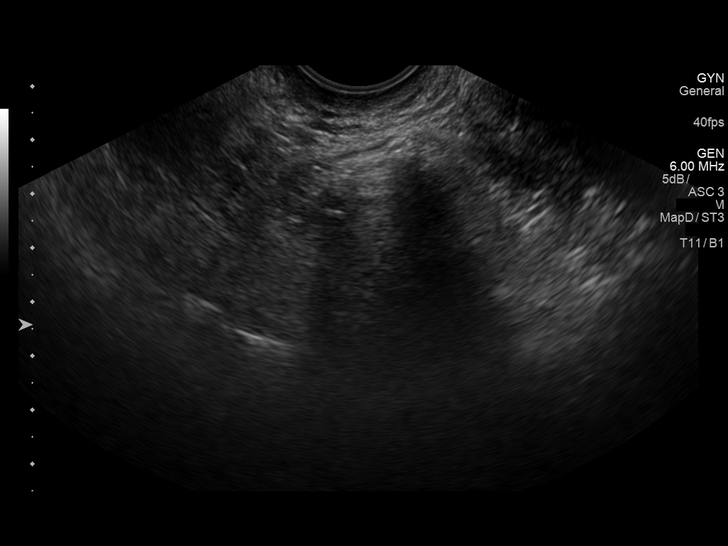
[im 73/88]
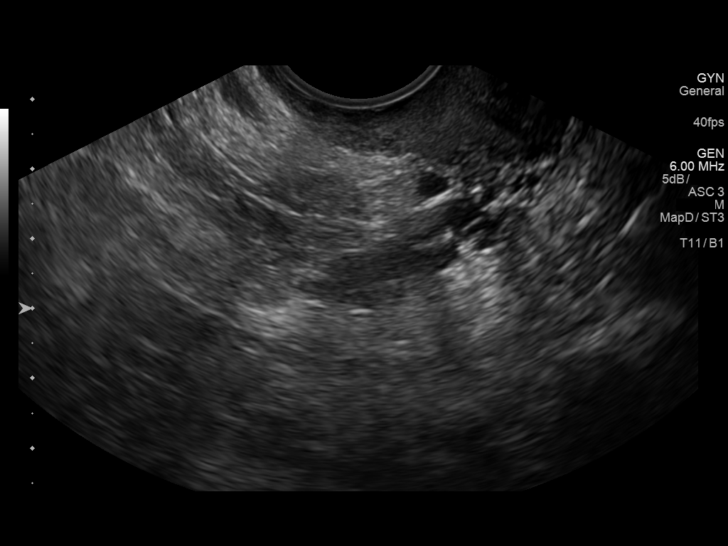
[im 80/88]
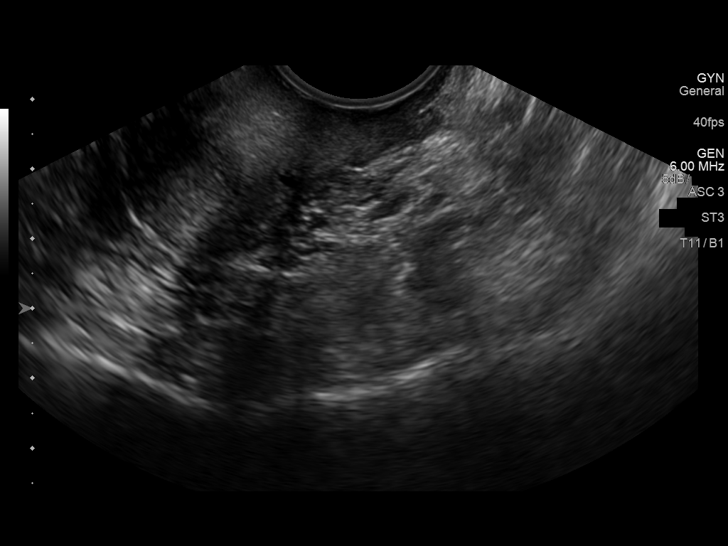
[im 88/88]
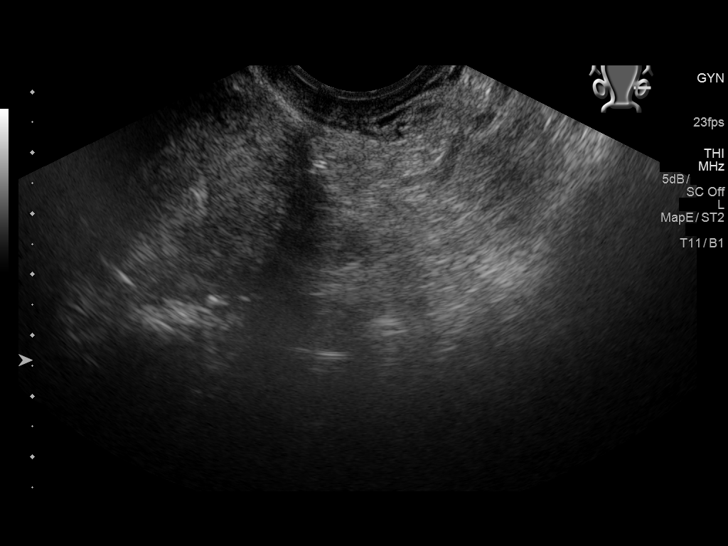

[14 of 25 positions shown; findings below may reference images not displayed]

FINDINGS: Uterus

Measurements: 7.2 x 3.9 x 4.1 cm. No fibroids or other mass
visualized.

Endometrium

Thickness: 3 mm.  No focal abnormality visualized.

Right ovary

Not clearly visualized, possibly obscured by nearby bowel.

Left ovary

Not clearly visualized, possibly obscured by nearby bowel.

Other findings

No abnormal free fluid.
IMPRESSION: 1. Normal appearance of the uterus.
2. Nonvisualization of the ovaries due to nearby bowel gas.
3. No free fluid in the pelvis.

## 2019-03-31 ENCOUNTER — Emergency Department
Admission: EM | Admit: 2019-03-31 | Discharge: 2019-03-31 | Disposition: A | Discharge status: Home / Self Care | Payer: Medicaid Other | Attending: Emergency Medicine | Admitting: Emergency Medicine

## 2019-03-31 ENCOUNTER — Encounter: Payer: Self-pay | Admitting: Emergency Medicine

## 2019-03-31 ENCOUNTER — Other Ambulatory Visit: Payer: Self-pay

## 2019-03-31 DIAGNOSIS — F17210 Nicotine dependence, cigarettes, uncomplicated: Secondary | ICD-10-CM | POA: Insufficient documentation

## 2019-03-31 DIAGNOSIS — R197 Diarrhea, unspecified: Secondary | ICD-10-CM | POA: Insufficient documentation

## 2019-03-31 DIAGNOSIS — J45909 Unspecified asthma, uncomplicated: Secondary | ICD-10-CM | POA: Insufficient documentation

## 2019-03-31 DIAGNOSIS — Z79899 Other long term (current) drug therapy: Secondary | ICD-10-CM | POA: Insufficient documentation

## 2019-03-31 LAB — URINALYSIS, COMPLETE (UACMP) WITH MICROSCOPIC
Bacteria, UA: NONE SEEN
Bilirubin Urine: NEGATIVE
Glucose, UA: NEGATIVE mg/dL
Ketones, ur: NEGATIVE mg/dL
Leukocytes,Ua: NEGATIVE
Nitrite: NEGATIVE
Protein, ur: NEGATIVE mg/dL
Specific Gravity, Urine: 1.019 (ref 1.005–1.030)
pH: 6 (ref 5.0–8.0)

## 2019-03-31 LAB — COMPREHENSIVE METABOLIC PANEL
ALT: 17 U/L (ref 0–44)
AST: 22 U/L (ref 15–41)
Albumin: 4.1 g/dL (ref 3.5–5.0)
Alkaline Phosphatase: 62 U/L (ref 38–126)
Anion gap: 9 (ref 5–15)
BUN: 14 mg/dL (ref 6–20)
CO2: 24 mmol/L (ref 22–32)
Calcium: 8.8 mg/dL — ABNORMAL LOW (ref 8.9–10.3)
Chloride: 108 mmol/L (ref 98–111)
Creatinine, Ser: 0.73 mg/dL (ref 0.44–1.00)
GFR calc Af Amer: >60 mL/min (ref >60)
GFR calc non Af Amer: >60 mL/min (ref >60)
Glucose, Bld: 106 mg/dL — ABNORMAL HIGH (ref 70–99)
Potassium: 3.9 mmol/L (ref 3.5–5.1)
Sodium: 141 mmol/L (ref 135–145)
Total Bilirubin: 0.4 mg/dL (ref 0.3–1.2)
Total Protein: 7.4 g/dL (ref 6.5–8.1)

## 2019-03-31 LAB — CBC
HCT: 36.1 % (ref 36.0–46.0)
Hemoglobin: 12.5 g/dL (ref 12.0–15.0)
MCH: 35.2 pg — ABNORMAL HIGH (ref 26.0–34.0)
MCHC: 34.6 g/dL (ref 30.0–36.0)
MCV: 101.7 fL — ABNORMAL HIGH (ref 80.0–100.0)
Platelets: 110 10*3/uL — ABNORMAL LOW (ref 150–400)
RBC: 3.55 MIL/uL — ABNORMAL LOW (ref 3.87–5.11)
RDW: 12.9 % (ref 11.5–15.5)
WBC: 8 10*3/uL (ref 4.0–10.5)
nRBC: 0 % (ref 0.0–0.2)

## 2019-03-31 LAB — LIPASE, BLOOD: Lipase: 33 U/L (ref 11–51)

## 2019-03-31 LAB — POCT PREGNANCY, URINE: Preg Test, Ur: NEGATIVE

## 2019-03-31 NOTE — ED Triage Notes (Signed)
Pt presents to ED c/o diarrhea x2-3 days. x6 today. Pt drinking soda during triage. Denies pain.

## 2019-03-31 NOTE — Discharge Instructions (Addendum)
Please seek medical attention for any high fevers, chest pain, shortness of breath, change in behavior, persistent vomiting, bloody stool or any other new or concerning symptoms.  

## 2019-03-31 NOTE — ED Provider Notes (Signed)
[facility] Emergency Department Provider Note  ____________________________________________   I have reviewed the triage vital signs and the nursing notes.   HISTORY  Chief Complaint Diarrhea   History limited by: Not Limited   HPI Cindy Moon is a 48 y.o. female who presents to the emergency department today because of concerns for diarrhea.  Patient states that she has had diarrhea for the past few days.  She had multiple episodes today.  She has not noticed any blood in her diarrhea.  She has had a little bit of nausea.  She denies any fevers.  She denies any unusual ingestions.  She has had slight discomfort in her abdomen without any significant pain.   Records reviewed. Per medical record review patient has a history of asthma.   Past Medical History:  Diagnosis Date  . Asthma     There are no active problems to display for this patient.   Past Surgical History:  Procedure Laterality Date  . tubal ligation      Prior to Admission medications   Medication Sig Start Date End Date Taking? Authorizing Provider  albuterol (PROVENTIL HFA;VENTOLIN HFA) 108 (90 Base) MCG/ACT inhaler Inhale 2 puffs into the lungs every 6 (six) hours as needed for wheezing or shortness of breath. 11/10/16   Darci CurrentBrown, Ashaway N, MD  azithromycin (ZITHROMAX Z-PAK) 250 MG tablet Take 2 tablets (500 mg) on  Day 1,  followed by 1 tablet (250 mg) once daily on Days 2 through 5. 02/14/19   Emily FilbertWilliams, Jonathan E, MD  azithromycin (ZITHROMAX) 250 MG tablet 2 tablets today, then 1 tablet for the next 4 days. 11/05/17   Triplett, Cari B, FNP  benzonatate (TESSALON PERLES) 100 MG capsule Take 1 capsule (100 mg total) by mouth 3 (three) times daily as needed. 03/12/19 03/11/20  Darci CurrentBrown,  N, MD  brompheniramine-pseudoephedrine-DM 30-2-10 MG/5ML syrup Take 5 mLs by mouth 4 (four) times daily as needed. Mixed with 5 mL of Viscous Lidocaine swish and swallow. 02/20/18   Joni ReiningSmith, Ronald K, PA-C   chlorpheniramine-HYDROcodone (TUSSIONEX PENNKINETIC ER) 10-8 MG/5ML SUER Take 5 mLs by mouth 2 (two) times daily. 02/14/19   Emily FilbertWilliams, Jonathan E, MD  guaiFENesin-codeine 100-10 MG/5ML syrup Take 5 mLs by mouth every 6 (six) hours as needed for cough. 01/19/19   Minna AntisPaduchowski, Kevin, MD  hydrocortisone 2.5 % cream Apply topically 2 (two) times daily. 01/24/18   Enid DerryWagner, Ashley, PA-C  ketoconazole (NIZORAL) 2 % cream Apply 1 application topically daily. 01/24/18   Enid DerryWagner, Ashley, PA-C  lidocaine (XYLOCAINE) 2 % solution Use as directed 5 mLs in the mouth or throat every 6 (six) hours as needed for mouth pain. Mixed with 5 mL of Bromfed-DM for swish and swallow. 02/20/18   Joni ReiningSmith, Ronald K, PA-C  meloxicam (MOBIC) 15 MG tablet Take 1 tablet (15 mg total) by mouth daily. 11/15/18   Cuthriell, Delorise RoyalsJonathan D, PA-C  methylPREDNISolone (MEDROL DOSEPAK) 4 MG TBPK tablet Take Tapered dose as directed 02/20/18   Joni ReiningSmith, Ronald K, PA-C  phenazopyridine (PYRIDIUM) 200 MG tablet Take 1 tablet (200 mg total) by mouth 3 (three) times daily as needed for pain. 04/28/18 04/28/19  Merrily Brittleifenbark, Neil, MD  predniSONE (STERAPRED UNI-PAK 21 TAB) 10 MG (21) TBPK tablet Dispense taper pack as directed 02/14/19   Emily FilbertWilliams, Jonathan E, MD    Allergies Sulfa antibiotics  History reviewed. No pertinent family history.  Social History Social History   Tobacco Use  . Smoking status: Current Some Day Smoker  Packs/day: 0.10    Types: Cigarettes  . Smokeless tobacco: Never Used  Substance Use Topics  . Alcohol use: Yes  . Drug use: No    Review of Systems Constitutional: No fever/chills Eyes: No visual changes. ENT: No sore throat. Cardiovascular: Denies chest pain. Respiratory: Denies shortness of breath. Gastrointestinal: Positive for diarrhea. Genitourinary: Negative for dysuria. Musculoskeletal: Negative for back pain. Skin: Negative for rash. Neurological: Negative for headaches, focal weakness or  numbness.  ____________________________________________   PHYSICAL EXAM:  VITAL SIGNS: ED Triage Vitals  Enc Vitals Group     BP 03/31/19 1805 112/70     Pulse Rate 03/31/19 1805 72     Resp 03/31/19 1805 18     Temp 03/31/19 1805 98.1 F (36.7 C)     Temp Source 03/31/19 1805 Oral     SpO2 03/31/19 1805 97 %     Weight 03/31/19 1806 160 lb (72.6 kg)     Height 03/31/19 1806 5\' 10"  (1.778 m)     Head Circumference --      Peak Flow --      Pain Score 03/31/19 1807 0   Constitutional: Alert and oriented.  Eyes: Conjunctivae are normal.  ENT      Head: Normocephalic and atraumatic.      Nose: No congestion/rhinnorhea.      Mouth/Throat: Mucous membranes are moist.      Neck: No stridor. Hematological/Lymphatic/Immunilogical: No cervical lymphadenopathy. Cardiovascular: Normal rate, regular rhythm.  No murmurs, rubs, or gallops.  Respiratory: Normal respiratory effort without tachypnea nor retractions. Breath sounds are clear and equal bilaterally. No wheezes/rales/rhonchi. Gastrointestinal: Soft and non tender. No rebound. No guarding.  Genitourinary: Deferred Musculoskeletal: Normal range of motion in all extremities. No lower extremity edema. Neurologic:  Normal speech and language. No gross focal neurologic deficits are appreciated.  Skin:  Skin is warm, dry and intact. No rash noted. Psychiatric: Mood and affect are normal. Speech and behavior are normal. Patient exhibits appropriate insight and judgment.  ____________________________________________    LABS (pertinent positives/negatives)  POCT urine negative UA clear, small hgb dipstick, 0-5 rbc and wbc Lipase 33 CMP wnl except glu 106, ca 8.8 CBC wbc 8.0, hgb 12.5, plt 110 ____________________________________________   EKG  None  ____________________________________________     RADIOLOGY  None  ____________________________________________   PROCEDURES  Procedures  ____________________________________________   INITIAL IMPRESSION / ASSESSMENT AND PLAN / ED COURSE  Pertinent labs & imaging results that were available during my care of the patient were reviewed by me and considered in my medical decision making (see chart for details).   Patient presented to the emergency department today because of concerns for diarrhea.  On exam patient's abdomen is benign.  Patient's work-up without concerning electrolyte abnormality or elevated creatinine.  No leukocytosis in the blood work.  At this point I think likely food poisoning versus GI bug.  Discussed this with the patient.  Will give patient formation about dietary changes that she can enact to help with diarrhea.  ____________________________________________   FINAL CLINICAL IMPRESSION(S) / ED DIAGNOSES  Final diagnoses:  Diarrhea, unspecified type     Note: This dictation was prepared with Dragon dictation. Any transcriptional errors that result from this process are unintentional     Phineas Semen, MD 03/31/19 2259

## 2019-05-06 ENCOUNTER — Encounter: Payer: Self-pay | Admitting: Emergency Medicine

## 2019-05-06 ENCOUNTER — Emergency Department
Admission: EM | Admit: 2019-05-06 | Discharge: 2019-05-06 | Disposition: A | Discharge status: Home / Self Care | Payer: Medicaid Other | Attending: Emergency Medicine | Admitting: Emergency Medicine

## 2019-05-06 ENCOUNTER — Other Ambulatory Visit: Payer: Self-pay

## 2019-05-06 ENCOUNTER — Emergency Department: Payer: Medicaid Other

## 2019-05-06 DIAGNOSIS — J45901 Unspecified asthma with (acute) exacerbation: Secondary | ICD-10-CM

## 2019-05-06 DIAGNOSIS — F410 Panic disorder [episodic paroxysmal anxiety] without agoraphobia: Secondary | ICD-10-CM

## 2019-05-06 DIAGNOSIS — F17210 Nicotine dependence, cigarettes, uncomplicated: Secondary | ICD-10-CM | POA: Insufficient documentation

## 2019-05-06 DIAGNOSIS — R1110 Vomiting, unspecified: Secondary | ICD-10-CM | POA: Insufficient documentation

## 2019-05-06 LAB — BLOOD GAS, VENOUS
Acid-Base Excess: 5.4 mmol/L — ABNORMAL HIGH (ref 0.0–2.0)
Bicarbonate: 25.2 mmol/L (ref 20.0–28.0)
O2 Saturation: 97.2 %
Patient temperature: 37
pCO2, Ven: 24 mmHg — ABNORMAL LOW (ref 44.0–60.0)
pH, Ven: 7.63 (ref 7.250–7.430)
pO2, Ven: 74 mmHg — ABNORMAL HIGH (ref 32.0–45.0)

## 2019-05-06 LAB — CBC WITH DIFFERENTIAL/PLATELET
Abs Immature Granulocytes: 0.02 10*3/uL (ref 0.00–0.07)
Basophils Absolute: 0 10*3/uL (ref 0.0–0.1)
Basophils Relative: 0 %
Eosinophils Absolute: 0.1 10*3/uL (ref 0.0–0.5)
Eosinophils Relative: 1 %
HCT: 36.3 % (ref 36.0–46.0)
Hemoglobin: 12.7 g/dL (ref 12.0–15.0)
Immature Granulocytes: 0 %
Lymphocytes Relative: 39 %
Lymphs Abs: 3.2 10*3/uL (ref 0.7–4.0)
MCH: 34.3 pg — ABNORMAL HIGH (ref 26.0–34.0)
MCHC: 35 g/dL (ref 30.0–36.0)
MCV: 98.1 fL (ref 80.0–100.0)
Monocytes Absolute: 0.5 10*3/uL (ref 0.1–1.0)
Monocytes Relative: 6 %
Neutro Abs: 4.5 10*3/uL (ref 1.7–7.7)
Neutrophils Relative %: 54 %
Platelets: 116 10*3/uL — ABNORMAL LOW (ref 150–400)
RBC: 3.7 MIL/uL — ABNORMAL LOW (ref 3.87–5.11)
RDW: 12.6 % (ref 11.5–15.5)
WBC: 8.4 10*3/uL (ref 4.0–10.5)
nRBC: 0 % (ref 0.0–0.2)

## 2019-05-06 LAB — BASIC METABOLIC PANEL
Anion gap: 12 (ref 5–15)
BUN: 14 mg/dL (ref 6–20)
CO2: 21 mmol/L — ABNORMAL LOW (ref 22–32)
Calcium: 9.4 mg/dL (ref 8.9–10.3)
Chloride: 107 mmol/L (ref 98–111)
Creatinine, Ser: 0.74 mg/dL (ref 0.44–1.00)
GFR calc Af Amer: >60 mL/min (ref >60)
GFR calc non Af Amer: >60 mL/min (ref >60)
Glucose, Bld: 99 mg/dL (ref 70–99)
Potassium: 3.8 mmol/L (ref 3.5–5.1)
Sodium: 140 mmol/L (ref 135–145)

## 2019-05-06 LAB — BRAIN NATRIURETIC PEPTIDE: B Natriuretic Peptide: 22 pg/mL (ref 0.0–100.0)

## 2019-05-06 MED ORDER — IPRATROPIUM-ALBUTEROL 0.5-2.5 (3) MG/3ML IN SOLN
3.0000 mL | Freq: Once | RESPIRATORY_TRACT | Status: AC
  Administered 2019-05-06: 3 mL via RESPIRATORY_TRACT
  Filled 2019-05-06: qty 3

## 2019-05-06 MED ORDER — METHYLPREDNISOLONE SODIUM SUCC 125 MG IJ SOLR
125.0000 mg | Freq: Once | INTRAMUSCULAR | Status: AC
  Administered 2019-05-06: 125 mg via INTRAVENOUS

## 2019-05-06 MED ORDER — LORAZEPAM 2 MG/ML IJ SOLN
1.0000 mg | Freq: Once | INTRAMUSCULAR | Status: AC
  Administered 2019-05-06: 1 mg via INTRAVENOUS
  Filled 2019-05-06: qty 1

## 2019-05-06 MED ORDER — METHYLPREDNISOLONE SODIUM SUCC 125 MG IJ SOLR
INTRAMUSCULAR | Status: AC
  Filled 2019-05-06: qty 2

## 2019-05-06 MED ORDER — LORAZEPAM 1 MG PO TABS: 1.0000 mg | ORAL_TABLET | Freq: Two times a day (BID) | ORAL | 0 refills | Status: AC

## 2019-05-06 MED ORDER — ALBUTEROL SULFATE (2.5 MG/3ML) 0.083% IN NEBU
INHALATION_SOLUTION | RESPIRATORY_TRACT | Status: AC
  Filled 2019-05-06: qty 6

## 2019-05-06 MED ORDER — PREDNISONE 50 MG PO TABS: ORAL_TABLET | ORAL | 0 refills | Status: AC

## 2019-05-06 MED ORDER — ALBUTEROL SULFATE HFA 108 (90 BASE) MCG/ACT IN AERS: 2.0000 | INHALATION_SPRAY | Freq: Four times a day (QID) | RESPIRATORY_TRACT | 1 refills | Status: AC | PRN

## 2019-05-06 NOTE — ED Provider Notes (Signed)
[facility] Emergency Department Provider Note       Time seen: ----------------------------------------- 8:40 AM on 05/06/2019 -----------------------------------------   I have reviewed the triage vital signs and the nursing notes.  HISTORY   Chief Complaint Shortness of Breath and Emesis    HPI Cindy Moon is a 48 y.o. female with a history of asthma who presents to the ED for shortness of breath.  Patient states she has been sick for the past 3 days, said vomiting and sweats.  She went to work and felt sick and left.  She then went home and passed out.  She presents hyperventilating, pain is 6 out of 10.  She denies fevers, chills or other coronavirus symptoms.  Past Medical History:  Diagnosis Date  . Asthma     There are no active problems to display for this patient.   Past Surgical History:  Procedure Laterality Date  . tubal ligation      Allergies Sulfa antibiotics  Social History Social History   Tobacco Use  . Smoking status: Current Some Day Smoker    Packs/day: 0.10    Types: Cigarettes  . Smokeless tobacco: Never Used  Substance Use Topics  . Alcohol use: Yes  . Drug use: No   Review of Systems Constitutional: Negative for fever. Cardiovascular: Negative for chest pain. Respiratory: Positive for shortness of breath Gastrointestinal: Negative for abdominal pain, vomiting and diarrhea. Musculoskeletal: Negative for back pain. Skin: Negative for rash. Neurological: Negative for headaches, focal weakness or numbness.  All systems negative/normal/unremarkable except as stated in the HPI  ____________________________________________   PHYSICAL EXAM:  VITAL SIGNS: ED Triage Vitals [05/06/19 0837]  Enc Vitals Group     BP      Pulse      Resp      Temp      Temp src      SpO2      Weight      Height      Head Circumference      Peak Flow      Pain Score 6     Pain Loc      Pain Edu?      Excl. in GC?     Constitutional: Alert and oriented.  Anxious and hyperventilating, mild to moderate distress Eyes: Conjunctivae are normal. Normal extraocular movements. ENT      Head: Normocephalic and atraumatic.      Nose: No congestion/rhinnorhea.      Mouth/Throat: Mucous membranes are moist.      Neck: No stridor. Cardiovascular: Normal rate, regular rhythm. No murmurs, rubs, or gallops. Respiratory: Tachypnea with mild wheezing bilaterally Gastrointestinal: Soft and nontender. Normal bowel sounds Musculoskeletal: Nontender with normal range of motion in extremities. No lower extremity tenderness nor edema. Neurologic:  No gross focal neurologic deficits are appreciated.  Skin:  Skin is warm, with some diaphoresis Psychiatric: Anxious mood and affect ____________________________________________  EKG: Interpreted by me.  Sinus rhythm the rate of 80 bpm, normal PR interval, normal QRS, normal QT  ____________________________________________  ED COURSE:  As part of my medical decision making, I reviewed the following data within the electronic MEDICAL RECORD NUMBER History obtained from family if available, nursing notes, old chart and ekg, as well as notes from prior ED visits. Patient presented for shortness of breath with a combination of asthma and panic attack, we will assess with labs and imaging as indicated at this time. Clinical Course as of May 05 1052  Thu  May 06, 2019  0924 Symptoms appear to have resolved at this time.   [JW]    Clinical Course User Index [JW] Earleen Newport, MD   Procedures  Cindy Moon was evaluated in Emergency Department on 05/06/2019 for the symptoms described in the history of present illness. She was evaluated in the context of the global COVID-19 pandemic, which necessitated consideration that the patient might be at risk for infection with the SARS-CoV-2 virus that causes COVID-19. Institutional protocols and algorithms that pertain to the evaluation of  patients at risk for COVID-19 are in a state of rapid change based on information released by regulatory bodies including the CDC and federal and state organizations. These policies and algorithms were followed during the patient's care in the ED.  ____________________________________________   LABS (pertinent positives/negatives)  Labs Reviewed  CBC WITH DIFFERENTIAL/PLATELET - Abnormal; Notable for the following components:      Result Value   RBC 3.70 (*)    MCH 34.3 (*)    Platelets 116 (*)    All other components within normal limits  BASIC METABOLIC PANEL - Abnormal; Notable for the following components:   CO2 21 (*)    All other components within normal limits  BLOOD GAS, VENOUS - Abnormal; Notable for the following components:   pH, Ven 7.63 (*)    pCO2, Ven 24 (*)    pO2, Ven 74.0 (*)    Acid-Base Excess 5.4 (*)    All other components within normal limits  BRAIN NATRIURETIC PEPTIDE    RADIOLOGY Images were viewed by me  Chest x-ray IMPRESSION: No acute abnormality. ____________________________________________   DIFFERENTIAL DIAGNOSIS   Asthma, anxiety, pneumonia, viral illness  FINAL ASSESSMENT AND PLAN  Panic attack, asthma   Plan: The patient had presented for dyspnea. Patient's labs indicated respiratory alkalosis, consistent with her presentation and hyperventilating. Patient's imaging reveal any acute process.  After a DuoNeb and Ativan she was asymptomatic.  She is cleared for outpatient follow-up.   Laurence Aly, MD    Note: This note was generated in part or whole with voice recognition software. Voice recognition is usually quite accurate but there are transcription errors that can and very often do occur. I apologize for any typographical errors that were not detected and corrected.     Earleen Newport, MD 05/06/19 1054

## 2019-05-06 NOTE — ED Triage Notes (Signed)
Says sick for 3 days-vomiting, sweats.  Today went to work and felt sick and left.  Went home and passed out.  Says she has asthma and  Anxiet.  She is currenly hyperventilating.

## 2019-05-06 NOTE — ED Notes (Signed)
Patient to vehicle in wheelchair. Spoke with patient's husband and updated him on patient's discharge instructions and follow-up care. Both verbalized understanding.

## 2019-06-15 ENCOUNTER — Other Ambulatory Visit: Payer: Self-pay

## 2019-06-15 DIAGNOSIS — Z20822 Contact with and (suspected) exposure to covid-19: Secondary | ICD-10-CM

## 2019-06-16 LAB — NOVEL CORONAVIRUS, NAA: SARS-CoV-2, NAA: NOT DETECTED

## 2019-07-22 ENCOUNTER — Other Ambulatory Visit: Payer: Self-pay

## 2019-07-22 ENCOUNTER — Emergency Department
Admission: EM | Admit: 2019-07-22 | Discharge: 2019-07-22 | Disposition: A | Discharge status: Home / Self Care | Payer: Medicaid Other | Attending: Emergency Medicine | Admitting: Emergency Medicine

## 2019-07-22 ENCOUNTER — Encounter: Payer: Self-pay | Admitting: Emergency Medicine

## 2019-07-22 DIAGNOSIS — J45909 Unspecified asthma, uncomplicated: Secondary | ICD-10-CM | POA: Insufficient documentation

## 2019-07-22 DIAGNOSIS — H1031 Unspecified acute conjunctivitis, right eye: Secondary | ICD-10-CM

## 2019-07-22 DIAGNOSIS — Z79899 Other long term (current) drug therapy: Secondary | ICD-10-CM | POA: Insufficient documentation

## 2019-07-22 DIAGNOSIS — F17210 Nicotine dependence, cigarettes, uncomplicated: Secondary | ICD-10-CM | POA: Insufficient documentation

## 2019-07-22 MED ORDER — CIPROFLOXACIN HCL 0.3 % OP SOLN: OPHTHALMIC | 0 refills | Status: AC

## 2019-07-22 MED ORDER — TETRACAINE HCL 0.5 % OP SOLN
2.0000 [drp] | Freq: Once | OPHTHALMIC | Status: AC
  Administered 2019-07-22: 21:00:00 2 [drp] via OPHTHALMIC
  Filled 2019-07-22: qty 4

## 2019-07-22 MED ORDER — OLOPATADINE HCL 0.2 % OP SOLN: 2.0000 [drp] | Freq: Every day | OPHTHALMIC | 0 refills | Status: AC

## 2019-07-22 NOTE — ED Triage Notes (Signed)
Patient ambulatory to triage with steady gait, without difficulty or distress noted, mask in place; pt reports rt eye redness, itching on Tuesday; used OTC eye flush without relief; took an amoxi yesterday but it made her throw up so stopped it; now cont to have eye pain and drainage

## 2019-07-22 NOTE — ED Provider Notes (Signed)
[facility] Emergency Department Provider Note ____________________________________________  Time seen: Approximately 8:56 PM  I have reviewed the triage vital signs and the nursing notes.   HISTORY  Chief Complaint Eye Problem   HPI Cindy Moon is a 48 y.o. female who presents to the emergency department for treatment and evaluation of right eye pain, drainage, and redness. Her Goddaughter had some form of pink eye last week and she thinks she now has it as well. She has used saline flush and took an amoxicillin, but the amoxicillin made her vomit so she didn't take any more.  Past Medical History:  Diagnosis Date  . Asthma     There are no active problems to display for this patient.   Past Surgical History:  Procedure Laterality Date  . tubal ligation      Prior to Admission medications   Medication Sig Start Date End Date Taking? Authorizing Provider  albuterol (PROVENTIL HFA;VENTOLIN HFA) 108 (90 Base) MCG/ACT inhaler Inhale 2 puffs into the lungs every 6 (six) hours as needed for wheezing or shortness of breath. 11/10/16   Darci Current, MD  albuterol (VENTOLIN HFA) 108 (90 Base) MCG/ACT inhaler Inhale 2 puffs into the lungs every 6 (six) hours as needed for wheezing or shortness of breath. 05/06/19   Emily Filbert, MD  azithromycin (ZITHROMAX Z-PAK) 250 MG tablet Take 2 tablets (500 mg) on  Day 1,  followed by 1 tablet (250 mg) once daily on Days 2 through 5. 02/14/19   Emily Filbert, MD  azithromycin (ZITHROMAX) 250 MG tablet 2 tablets today, then 1 tablet for the next 4 days. 11/05/17   Derry Arbogast B, FNP  benzonatate (TESSALON PERLES) 100 MG capsule Take 1 capsule (100 mg total) by mouth 3 (three) times daily as needed. 03/12/19 03/11/20  Darci Current, MD  brompheniramine-pseudoephedrine-DM 30-2-10 MG/5ML syrup Take 5 mLs by mouth 4 (four) times daily as needed. Mixed with 5 mL of Viscous Lidocaine swish and swallow. 02/20/18    Joni Reining, PA-C  chlorpheniramine-HYDROcodone (TUSSIONEX PENNKINETIC ER) 10-8 MG/5ML SUER Take 5 mLs by mouth 2 (two) times daily. 02/14/19   Emily Filbert, MD  ciprofloxacin (CILOXAN) 0.3 % ophthalmic solution Place 1 drop into the right eye every 2 (two) hours while awake for 2 days, THEN 1 drop every 4 (four) hours while awake for 5 days. Administer 1 drop, every 2 hours, while awake, for 2 days. Then 1 drop, every 4 hours, while awake, for the next 5 days.. 07/22/19 07/29/19  Shakinah Navis, Rulon Eisenmenger B, FNP  guaiFENesin-codeine 100-10 MG/5ML syrup Take 5 mLs by mouth every 6 (six) hours as needed for cough. 01/19/19   Minna Antis, MD  hydrocortisone 2.5 % cream Apply topically 2 (two) times daily. 01/24/18   Enid Derry, PA-C  ketoconazole (NIZORAL) 2 % cream Apply 1 application topically daily. 01/24/18   Enid Derry, PA-C  lidocaine (XYLOCAINE) 2 % solution Use as directed 5 mLs in the mouth or throat every 6 (six) hours as needed for mouth pain. Mixed with 5 mL of Bromfed-DM for swish and swallow. 02/20/18   Joni Reining, PA-C  LORazepam (ATIVAN) 1 MG tablet Take 1 tablet (1 mg total) by mouth 2 (two) times daily. 05/06/19 05/05/20  Emily Filbert, MD  meloxicam (MOBIC) 15 MG tablet Take 1 tablet (15 mg total) by mouth daily. 11/15/18   Cuthriell, Delorise Royals, PA-C  methylPREDNISolone (MEDROL DOSEPAK) 4 MG TBPK tablet Take Tapered dose as  directed 02/20/18   Joni ReiningSmith, Ronald K, PA-C  Olopatadine HCl 0.2 % SOLN Apply 2 drops to eye daily. 07/22/19   Kem Boroughsriplett, Nancy Arvin B, FNP  predniSONE (DELTASONE) 50 MG tablet Take one table by mouth daily 05/06/19   Emily FilbertWilliams, Jonathan E, MD  predniSONE (STERAPRED UNI-PAK 21 TAB) 10 MG (21) TBPK tablet Dispense taper pack as directed 02/14/19   Emily FilbertWilliams, Jonathan E, MD    Allergies Sulfa antibiotics  No family history on file.  Social History Social History   Tobacco Use  . Smoking status: Current Some Day Smoker    Packs/day: 0.10    Types:  Cigarettes  . Smokeless tobacco: Never Used  Substance Use Topics  . Alcohol use: Yes  . Drug use: No    Review of Systems   Constitutional: No fever/chills Eyes: Negative for visual changes. Positive for pain. Positive for drainage. Musculoskeletal: Negative for pain. Skin: Negative for rash. Neurological: Negative for headaches, focal weakness or numbness. Allergic: Negative for seasonal allergies. ____________________________________________  PHYSICAL EXAM:  VITAL SIGNS: ED Triage Vitals  Enc Vitals Group     BP 07/22/19 1940 128/89     Pulse Rate 07/22/19 1940 (!) 104     Resp 07/22/19 1940 20     Temp 07/22/19 1940 98.6 F (37 C)     Temp Source 07/22/19 1940 Oral     SpO2 07/22/19 1940 100 %     Weight 07/22/19 1941 155 lb (70.3 kg)     Height 07/22/19 1941 5\' 10"  (1.778 m)     Head Circumference --      Peak Flow --      Pain Score 07/22/19 1941 10     Pain Loc --      Pain Edu? --      Excl. in GC? --     Constitutional: Alert and oriented. Well appearing and in no acute distress. Eyes: Visual acuity--see nursing documentation; no globe trauma; Eyelids normal to inspection; Sclera appears anicteric.  Eyelids not inverted. Conjunctiva appears erythematous with clear drainage; Cornea clear. Head: Atraumatic. Nose: No congestion/rhinnorhea. Mouth/Throat: Mucous membranes are moist.  Oropharynx non-erythematous. Respiratory: Respirations even and unlabored. Breath sounds clear to auscultation. Musculoskeletal:Normal ROM x 4 extremities. Neurologic:  Normal speech and language. No gross focal neurologic deficits are appreciated. Speech is normal. No gait instability. Skin:  Skin is warm, dry and intact. No rash noted. Psychiatric: Mood and affect are normal. Speech and behavior are normal.  ____________________________________________   LABS (all labs ordered are listed, but only abnormal results are displayed)  Labs Reviewed - No data to  display ____________________________________________  EKG  Not indicated ____________________________________________  RADIOLOGY  Not indicated ____________________________________________   PROCEDURES  Procedure(s) performed: None ____________________________________________   INITIAL IMPRESSION / ASSESSMENT AND PLAN / ED COURSE  48 year old female who presents to the emergency department for treatment and evaluation of right eye pain. Visual acuity 20/40 in right eye, 20/30 in left and 20/30 OS.  Symptoms and exam are consistent with viral conjunctivitis. She will be treated with both pataday and cipro drops. She is to follow up with ophthalmology if not improving over the next 2-3 days.   Pertinent labs & imaging results that were available during my care of the patient were reviewed by me and considered in my medical decision making (see chart for details). ____________________________________________   FINAL CLINICAL IMPRESSION(S) / ED DIAGNOSES  Final diagnoses:  Acute conjunctivitis of right eye, unspecified acute conjunctivitis type  Note:  This document was prepared using Dragon voice recognition software and may include unintentional dictation errors.    Victorino Dike, FNP 07/22/19 2159    Delman Kitten, MD 07/23/19 [phone]

## 2019-08-19 ENCOUNTER — Encounter: Payer: Self-pay | Admitting: Emergency Medicine

## 2019-08-19 ENCOUNTER — Other Ambulatory Visit: Payer: Self-pay

## 2019-08-19 ENCOUNTER — Emergency Department
Admission: EM | Admit: 2019-08-19 | Discharge: 2019-08-19 | Disposition: A | Discharge status: Home / Self Care | Payer: No Typology Code available for payment source | Attending: Emergency Medicine | Admitting: Emergency Medicine

## 2019-08-19 ENCOUNTER — Emergency Department: Payer: No Typology Code available for payment source

## 2019-08-19 DIAGNOSIS — Y9389 Activity, other specified: Secondary | ICD-10-CM | POA: Diagnosis not present

## 2019-08-19 DIAGNOSIS — Y998 Other external cause status: Secondary | ICD-10-CM | POA: Diagnosis not present

## 2019-08-19 DIAGNOSIS — S39012A Strain of muscle, fascia and tendon of lower back, initial encounter: Secondary | ICD-10-CM | POA: Diagnosis not present

## 2019-08-19 DIAGNOSIS — F17210 Nicotine dependence, cigarettes, uncomplicated: Secondary | ICD-10-CM | POA: Insufficient documentation

## 2019-08-19 DIAGNOSIS — Y929 Unspecified place or not applicable: Secondary | ICD-10-CM | POA: Diagnosis not present

## 2019-08-19 DIAGNOSIS — Z79899 Other long term (current) drug therapy: Secondary | ICD-10-CM | POA: Diagnosis not present

## 2019-08-19 DIAGNOSIS — S3992XA Unspecified injury of lower back, initial encounter: Secondary | ICD-10-CM | POA: Diagnosis present

## 2019-08-19 DIAGNOSIS — J45909 Unspecified asthma, uncomplicated: Secondary | ICD-10-CM | POA: Diagnosis not present

## 2019-08-19 MED ORDER — HYDROMORPHONE HCL 1 MG/ML IJ SOLN
1.0000 mg | Freq: Once | INTRAMUSCULAR | Status: AC
  Administered 2019-08-19: 1 mg via INTRAMUSCULAR
  Filled 2019-08-19: qty 1

## 2019-08-19 MED ORDER — CYCLOBENZAPRINE HCL 10 MG PO TABS: 10.0000 mg | ORAL_TABLET | Freq: Three times a day (TID) | ORAL | 0 refills | Status: AC | PRN

## 2019-08-19 MED ORDER — TRAMADOL HCL 50 MG PO TABS: 50.0000 mg | ORAL_TABLET | Freq: Four times a day (QID) | ORAL | 0 refills | Status: AC | PRN

## 2019-08-19 MED ORDER — ORPHENADRINE CITRATE 30 MG/ML IJ SOLN
60.0000 mg | Freq: Two times a day (BID) | INTRAMUSCULAR | Status: DC
  Administered 2019-08-19: 60 mg via INTRAMUSCULAR
  Filled 2019-08-19: qty 2
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
## 2019-08-19 NOTE — ED Notes (Addendum)
Pain worsened after PT two days ago, pt reports pain has not gotten this bad before. Pt took prescribed 300mg  Gabapentin and Meloxicam 15 mg last night, reported no improvement. Has not taken anything for pain today. Pt is in obvious pain, groaning and rolling in bed. Pt prefers to be sitting upright instead of lying back. Pt reports pain is lower back and bilateral, but worse on the right side
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
## 2019-08-19 NOTE — Discharge Instructions (Signed)
Follow-up with scheduled physical therapy.

## 2019-09-15 ENCOUNTER — Other Ambulatory Visit: Payer: Self-pay | Admitting: Family Medicine

## 2019-09-15 DIAGNOSIS — Z1231 Encounter for screening mammogram for malignant neoplasm of breast: Secondary | ICD-10-CM

## 2020-06-20 ENCOUNTER — Other Ambulatory Visit
Admission: RE | Admit: 2020-06-20 | Discharge: 2020-06-20 | Disposition: A | Discharge status: Home / Self Care | Payer: Self-pay | Source: Ambulatory Visit | Attending: Physician Assistant | Admitting: Physician Assistant

## 2020-06-20 DIAGNOSIS — Z20822 Contact with and (suspected) exposure to covid-19: Secondary | ICD-10-CM | POA: Insufficient documentation

## 2020-06-20 DIAGNOSIS — Z20828 Contact with and (suspected) exposure to other viral communicable diseases: Secondary | ICD-10-CM | POA: Insufficient documentation

## 2020-06-20 LAB — COVID-19 PCR

## 2020-06-20 LAB — COVID-19 NAAT (PCR): COVID-19 NAAT (PCR): NEGATIVE

## 2020-08-03 IMAGING — DX PORTABLE CHEST - 1 VIEW
2 series · 2 of 2 positions shown · non-contrast
Comparison: 03/12/2019.

CLINICAL DATA: Vomiting.  Sweats.

EXAM:
PORTABLE CHEST 1 VIEW

[chest ap (1 of 2)]
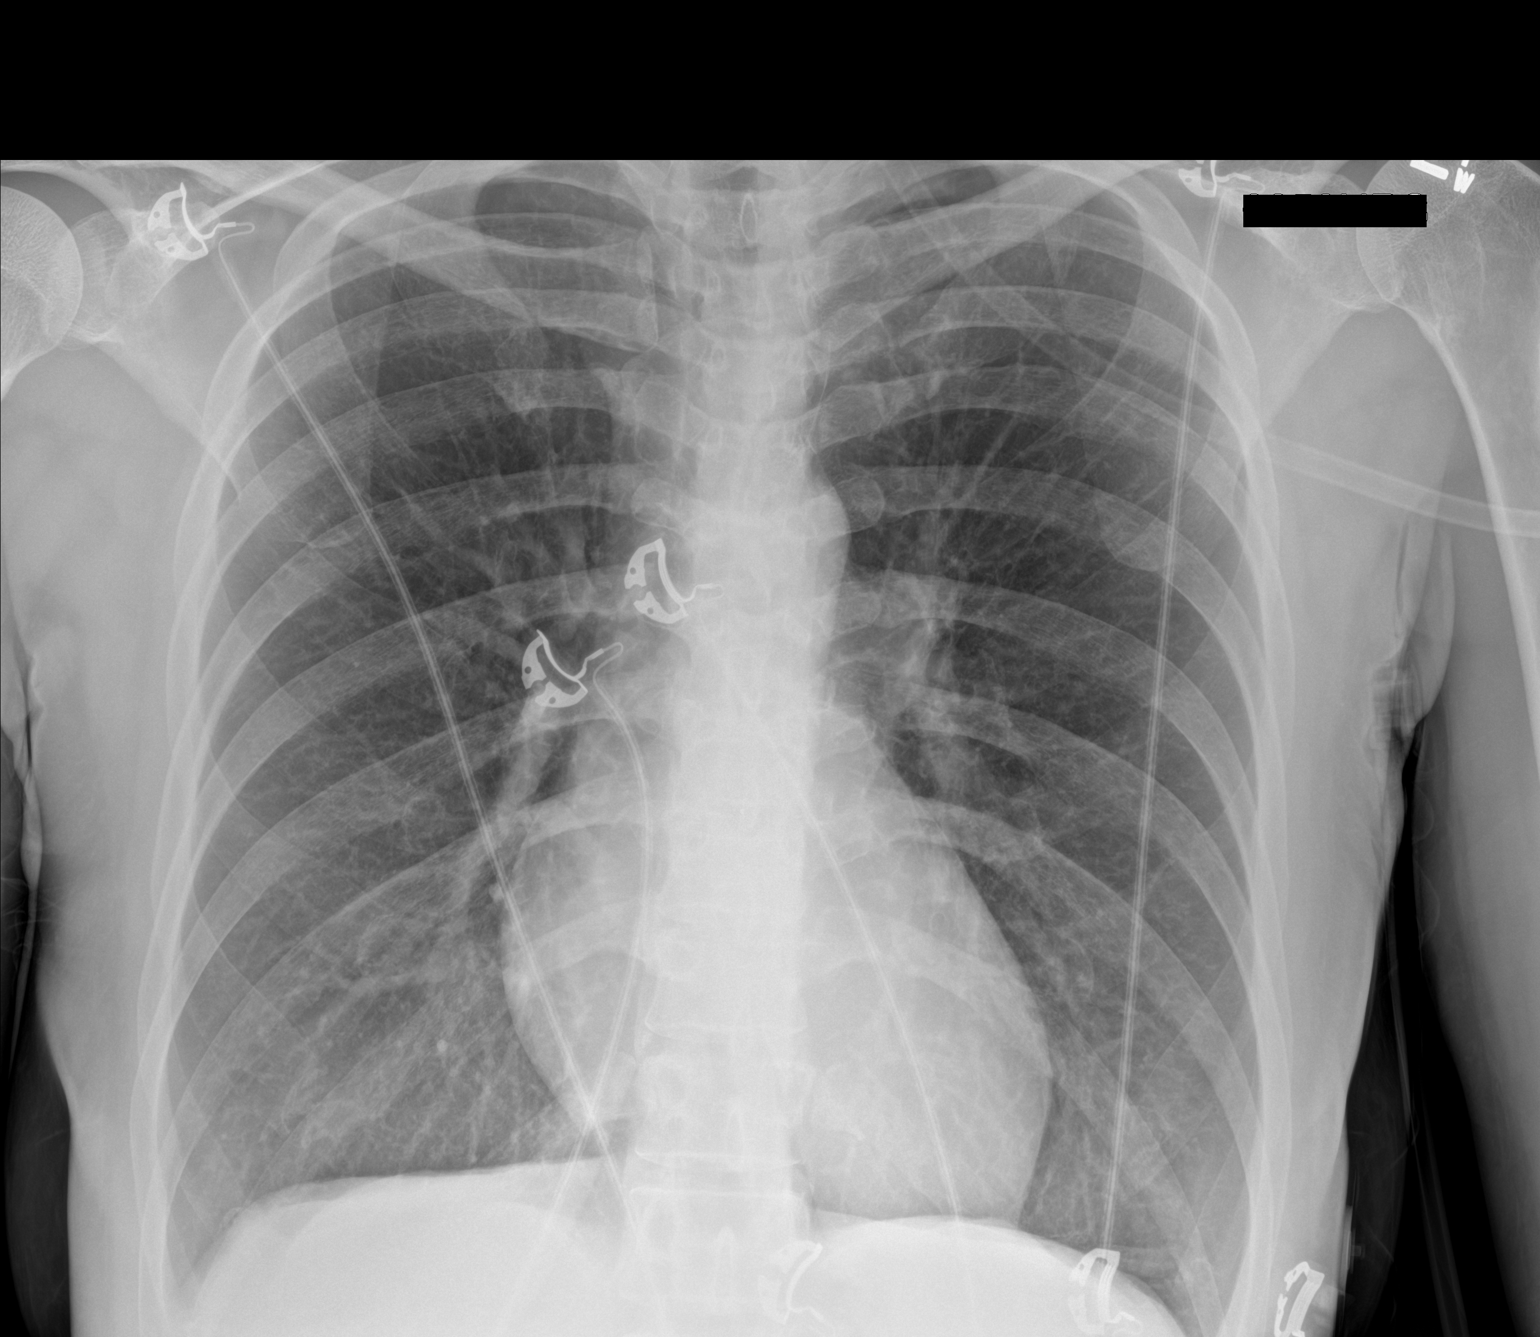

[chest ap (2 of 2)]
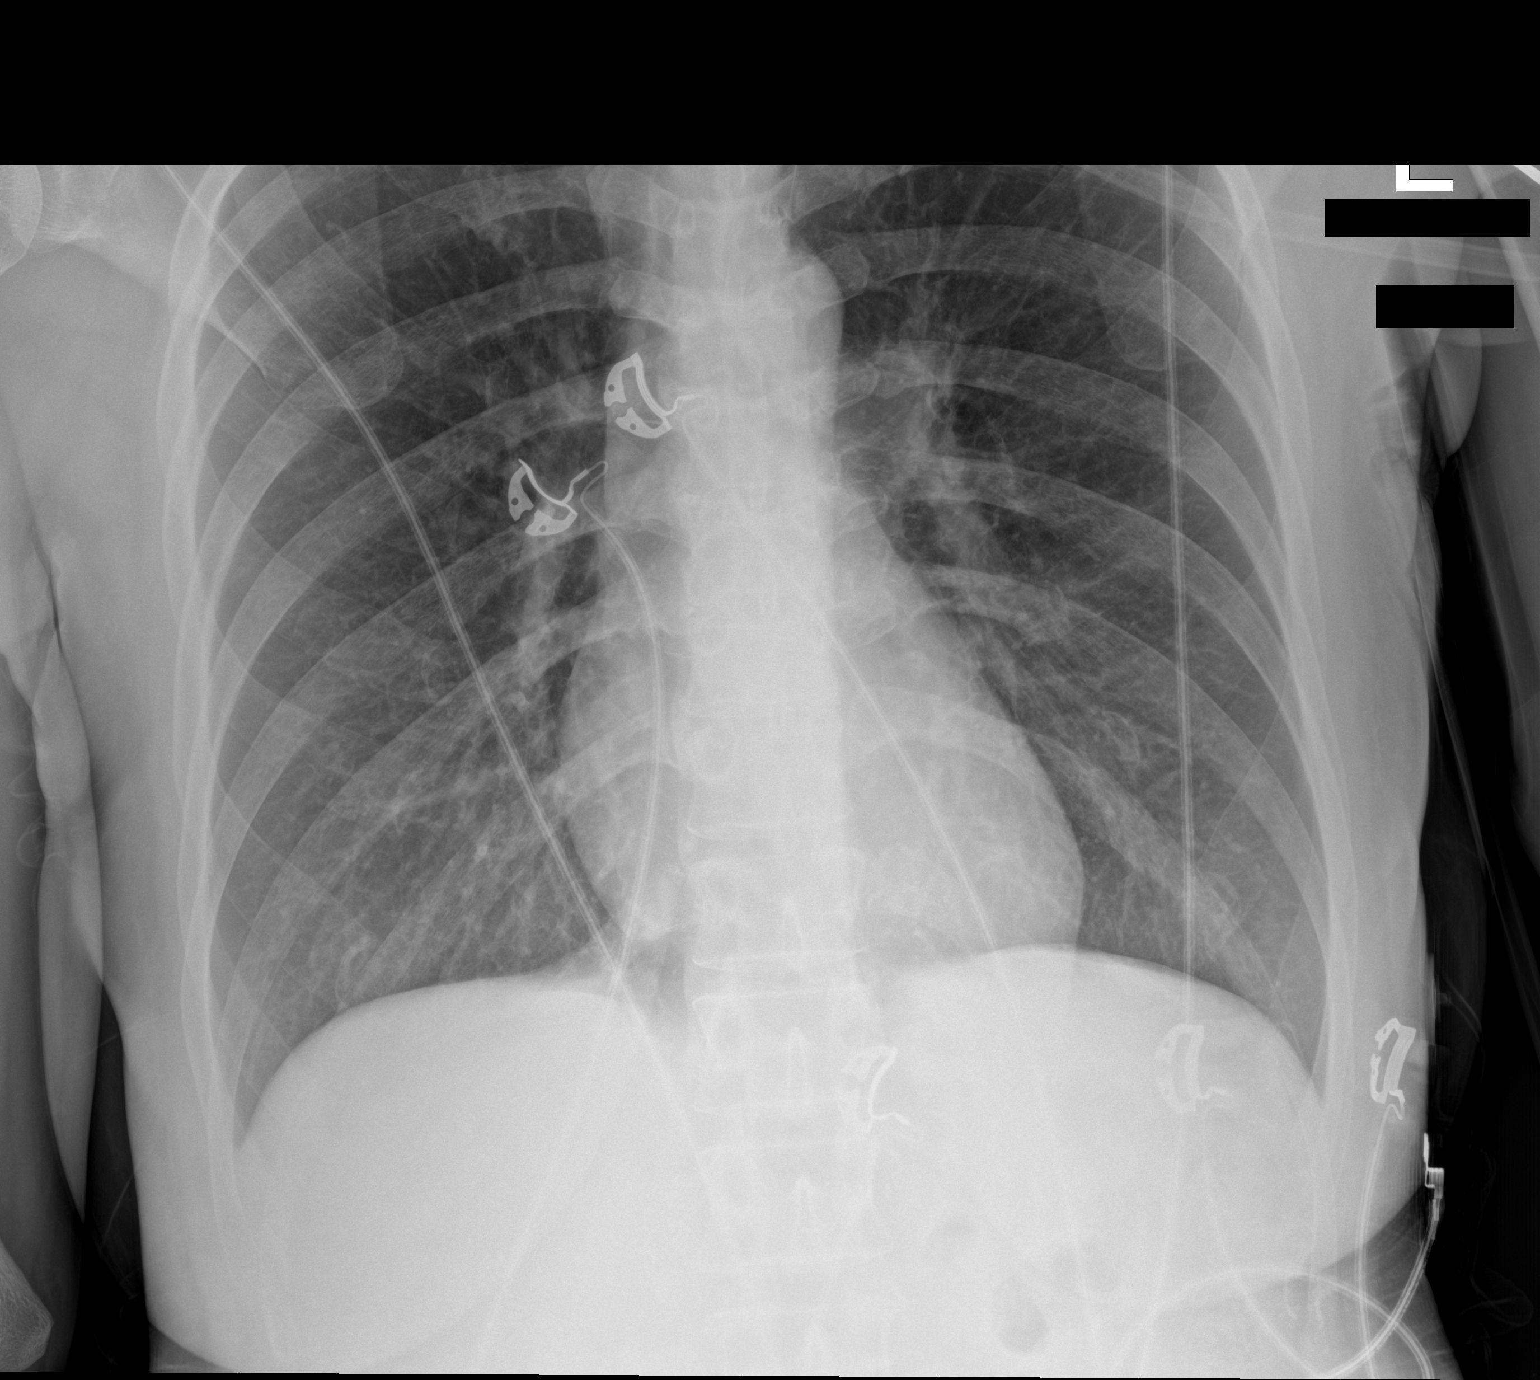

[2 of 2 positions shown; findings below may reference images not displayed]

FINDINGS: Mediastinum and hilar structures normal. Lungs are clear. No pleural
effusion or pneumothorax. Heart size stable. No acute bony
abnormality.
IMPRESSION: No acute abnormality.

## 2020-12-04 LAB — UNMAPPED LAB RESULTS
Basophil # (HT): 0 10 3/uL (ref 0.0–0.2)
Basophil % (HT): 0 % (ref 0–2)
Eosinophil # (HT): 0.1 10 3/uL (ref 0.0–0.5)
Eosinophil % (HT): 2 % (ref 0–7)
Hematocrit (HT): 36 % (ref 34–47)
Hemoglobin (HGB) (HT): 12.3 g/dL (ref 11.5–16.0)
Lymphocyte # (HT): 2.5 10 3/uL (ref 0.9–3.8)
Lymphocyte % (HT): 40 % (ref 17–44)
MCHC (HT): 34.3 g/dL (ref 32.0–36.0)
MCV (HT): 97 fL (ref 81.0–99.0)
Mean Corpuscular Hemoglobin (MCH) (HT): 33.2 pg (ref 26.0–34.0)
Monocyte # (HT): 0.5 10 3/uL (ref 0.2–1.0)
Monocyte % (HT): 7 % (ref 4–12)
Neutrophil # (HT): 3.2 10 3/uL (ref 1.5–7.7)
Platelets (HT): 130 10 3/uL — ABNORMAL LOW (ref 140–400)
RBC (HT): 3.7 10 6/uL — ABNORMAL LOW (ref 3.80–5.20)
RDW (HT): 13 % (ref 11.5–15.0)
Rubella IgG AB (HT): 93.6 (ref >9.9)
Seg Neut % (HT): 50 % (ref 40–75)
WBC (HT): 6.4 10 3/uL (ref 4.0–10.8)

## 2022-01-06 ENCOUNTER — Other Ambulatory Visit: Payer: Self-pay

## 2022-01-06 ENCOUNTER — Ambulatory Visit: Payer: Medicaid (Managed Care) | Attending: Emergency Medicine | Admitting: Emergency Medicine

## 2022-01-06 ENCOUNTER — Ambulatory Visit
Admission: RE | Admit: 2022-01-06 | Discharge: 2022-01-06 | Disposition: A | Discharge status: Home / Self Care | Payer: Medicaid (Managed Care) | Source: Ambulatory Visit

## 2022-01-06 VITALS — BP 143/88 | HR 80 | Temp 96.8°F | Resp 18

## 2022-01-06 DIAGNOSIS — R2242 Localized swelling, mass and lump, left lower limb: Secondary | ICD-10-CM

## 2022-01-06 DIAGNOSIS — M85872 Other specified disorders of bone density and structure, left ankle and foot: Secondary | ICD-10-CM

## 2022-01-06 DIAGNOSIS — M79675 Pain in left toe(s): Secondary | ICD-10-CM

## 2022-01-06 DIAGNOSIS — M19072 Primary osteoarthritis, left ankle and foot: Secondary | ICD-10-CM

## 2022-01-06 MED ORDER — CEPHALEXIN 500 MG PO CAPS *I*: 500.0000 mg | ORAL_CAPSULE | Freq: Four times a day (QID) | ORAL | 0 refills | Status: AC

## 2022-01-06 NOTE — Patient Instructions (Signed)
Soak foot in Epsom salts 3 times per day for 15-20 minutes.  Use medication as prescribed.  Follow-up with PCP as needed.    PLEASE REVIEW ALL INSTRUCTIONS FOR DETAILS AND CONTENT CONTAINED IN THE DISCHARGE MATERIALS NOT COVERED AT DISCHARGE.    Thank you Nikol Lemar for coming to [facility] for your health care concerns.    If your condition changes and/or worsens please follow up with your primary doctor and/or return to the urgent care center.    In the event of an emergency please dial 911.

## 2022-01-06 NOTE — UC Provider Note (Signed)
History     Chief Complaint   Patient presents with    Foot Pain     Left foot (toe pain) - can't put pressure on foot without it hurting.  No injury.  Swelling.  No calf pain.       51 year old female presents for evaluation of left great toe pain which began 3 weeks ago.  Patient denies any injury or trauma.  Patient woke this morning feeling the toe was swollen and she could not put weight on it without it hurting.  Patient describes pain around the nail which radiates to the dorsum of her foot "like a burning sensation".  Patient denies any joint problems such as arthritis or gout.  Patient has thickened nails and often gets her toes painted.       History provided by:  Patient  Language interpreter used: No        Medical/Surgical/Family History     History reviewed. No pertinent past medical history.     There is no problem list on file for this patient.           History reviewed. No pertinent surgical history.  History reviewed. No pertinent family history.       Social History     Tobacco Use    Smoking status: Every Day     Types: Cigarettes    Smokeless tobacco: Never     Living Situation     Questions Responses    Patient lives with     Homeless     Caregiver for other family member     External Services     Employment     Domestic Violence Risk                 Review of Systems   Review of Systems   Constitutional: Negative.    HENT: Negative.    Eyes: Negative.    Respiratory: Negative.    Cardiovascular: Negative.    Gastrointestinal: Negative.    Endocrine: Negative.    Genitourinary: Negative.    Musculoskeletal: Positive for arthralgias, gait problem and joint swelling.   Skin: Negative for color change.   Allergic/Immunologic: Negative.    Hematological: Negative.    Psychiatric/Behavioral: Negative.        Physical Exam   Vitals      First Recorded BP: 143/88, Resp: 18, Temp: 36 C (96.8 F) Oxygen Therapy SpO2: 99 %, Heart Rate: 80, (01/06/22 1219)  .      Physical Exam  Vitals and nursing  note reviewed.   Constitutional:       Appearance: She is well-developed.   HENT:      Head: Normocephalic and atraumatic.   Eyes:      Conjunctiva/sclera: Conjunctivae normal.   Pulmonary:      Effort: Pulmonary effort is normal.   Musculoskeletal:         General: Normal range of motion.      Left foot: Normal capillary refill. Swelling (very mild along nail borders), tenderness (with very light touch) and bony tenderness present. No deformity or crepitus. Normal pulse.   Skin:     General: Skin is warm and dry.   Neurological:      Mental Status: She is alert and oriented to person, place, and time.   Psychiatric:         Behavior: Behavior normal.         Thought Content: Thought content normal.  Judgment: Judgment normal.          Medical Decision Making   Medical Decision Making  Assessment:    Martha Ayala is a 51 y.o. female significant pain along nail border left great toe.  Patient also reports the pain radiates to the dorsum of her foot described as a burning sensation.  X-ray shows no acute bony pathology.  Slight swelling appreciated along medial nail border.  Attempted I&D, no return.  Suspect possible infection along nail border.       Differential diagnosis:    Paronychia  Arthritis  Gout  Contusion  Fracture      Plan and Results:    We will treat patient empirically with a course of oral antibiotics given swelling and tenderness along nail border.  Patient advised to soak feet in Epsom salts and follow-up with PCP as needed.  Encounter orders    Orders Placed This Encounter      * Foot LEFT standard AP, Lateral, Obliqu      cephalexin (KEFLEX) 500 mg capsule        XRay results  * Foot LEFT standard AP, Lateral, Obliqu    Result Date: 01/06/2022  01/06/2022 12:55 PM LEFT FOOT X-RAYS CLINICAL INFORMATION:  pain in left great toe; describes burning sesnation across top of foot; denies injury or trauma, M79.675-Pain in left toe(s). COMPARISON:  None. PROCEDURE: Three projections of the left foot  were obtained. IMPRESSION/FINDINGS:  Osteopenia. No displaced fractures. No subluxations. Distal Achilles insertional enthesophyte. Minimal degenerative change at the talonavicular joint. There is minimal soft tissue swelling adjacent to the first metatarsal phalangeal joint, though no significant degenerative changes are seen at the first metatarsophalangeal joint. END OF IMPRESSION [facility] submits this DICOM format image data and final report to the [facility], an independent secure electronic health information exchange, on a reciprocally searchable basis (with patient authorization) for a minimum of 12 months after exam date.    Independent Review of: XRays this encounter and chart/prior records    Diagnosis and Disposition:       Return precautions discussed and provided on AVS.    Discharge Medications  Start Taking          cephalexin (KEFLEX) 500 mg capsule Take 1 capsule (500 mg total) by mouth   4 times daily for 5 days  for Infection of the Skin and/or Skin Structures             Follow-up  Follow up in about 3 days (around 01/09/2022), or if symptoms worsen or fail to improve, for PCP Evaluation.            Final Diagnosis    ICD-10-CM ICD-9-CM   1. Pain of toe of left foot  M79.675 729.5         Martha Korte Corrie Mckusick, PA
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
## 2022-03-04 NOTE — ED Notes (Addendum)
Agree with triage note. Pt has went to CT for imaging.Pt c/o headache, ice pack given. Awaiting medical evaluation.

## 2022-03-04 NOTE — ED Notes (Signed)
Pt reports ice is not helping with headache, orders for tylenol, pt medicated. Pt states "is that just plain tylenol, that will not work". Updated on wait.

## 2022-03-11 ENCOUNTER — Other Ambulatory Visit: Payer: Self-pay

## 2022-03-11 ENCOUNTER — Ambulatory Visit: Payer: Auto Insurance (includes no fault) | Attending: Vascular Surgery | Admitting: Vascular Surgery

## 2022-03-11 VITALS — BP 168/111 | HR 79 | Temp 97.0°F | Resp 18

## 2022-03-11 DIAGNOSIS — M549 Dorsalgia, unspecified: Secondary | ICD-10-CM | POA: Insufficient documentation

## 2022-03-11 MED ORDER — CYCLOBENZAPRINE HCL 10 MG PO TABS *I*
10.0000 mg | ORAL_TABLET | Freq: Three times a day (TID) | ORAL | 0 refills | Status: AC | PRN
Start: 2022-03-11 — End: 2022-03-18

## 2022-03-11 MED ORDER — KETOROLAC TROMETHAMINE 30 MG/ML IJ SOLN *I*
30.0000 mg | Freq: Once | INTRAMUSCULAR | Status: AC
Start: 2022-03-11 — End: 2022-03-11
  Administered 2022-03-11: 30 mg via INTRAMUSCULAR
  Filled 2022-03-11: qty 1

## 2022-03-11 MED ORDER — MELOXICAM 7.5 MG PO TABS *I*
7.5000 mg | ORAL_TABLET | Freq: Every day | ORAL | 0 refills | Status: AC
Start: 2022-03-11 — End: 2022-04-10

## 2022-03-11 MED ORDER — ACETAMINOPHEN 325 MG PO TABS *I*
975.0000 mg | ORAL_TABLET | Freq: Once | ORAL | Status: AC
Start: 2022-03-11 — End: 2022-03-11
  Administered 2022-03-11: 975 mg via ORAL
  Filled 2022-03-11: qty 3

## 2022-03-11 MED ORDER — ONDANSETRON 4 MG PO TBDP *I*
4.0000 mg | ORAL_TABLET | Freq: Once | ORAL | Status: AC
Start: 2022-03-11 — End: 2022-03-11
  Administered 2022-03-11: 4 mg via ORAL
  Filled 2022-03-11: qty 1
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
## 2022-03-21 DIAGNOSIS — I10 Essential (primary) hypertension: Secondary | ICD-10-CM | POA: Insufficient documentation

## 2022-04-05 NOTE — Patient Instructions (Addendum)
[facility]    We provide a multidisciplinary pain management program to allow you to develop ways to manage your chronic pain in a safe and sustainable manner. Chronic pain cannot be "eliminated" or "made go away", but it can be managed in a way that allows you to be highly functional and have a high quality of life.     To "get there", focus on the idea that what we can do and how we live our lives is as important as the momentary pain that comes with these life events. These experiences can sustain [place] and help [place] better enjoy our lives and can have profound effects on our families and friends.    In terms of specific treatment, active exercise and daily stretching is key. Many patients are given referrals for physical therapy. This treatment is not specifically to make the pain better, but it is to keep you as active, mobile, and as functional as possible, especially with your chronic pain. The therapy may be uncomfortable and, at times, hurt. Please be aware that you will be using muscles and performing activities which you probably have not done in a long time. This is one reason you will hurt. It is important to not give up just because it hurts, as long as it is not severe and crippling. That discomfort gets better the longer you keep at it and will make you better able to function. Exercises also protect your body, improve your balance and can slow arthritis.     Chronic pain causes significant effects not only on our bodies, but also on our emotions, our mood, our outlook on life. Chronic pain makes [place] feel that we cannot do things, but if we stop doing  activities and participating in life events, we suffer, and our families and friends may also be affected. You can use your mind - what makes [place] special - to change your experience of chronic pain and make it manageable and more tolerable. Yes, you cannot think away your pain, but you can change how pain impacts you. This is  can be achieved through cognitive behavioral therapy, mindfulness, or acceptance and commitment therapy.  Please consider these therapies as a vital part of your treatment plan and we encourage you to attend any appointments if you are referred to a pain therapist. If you are interested and did not get a referral, please contact the office for a referral.     You may be a candidate for injections or medications. These therapies can help short term by temporarily reducing chronic pain. If you are offered an injection or medication, please remember that they are short lived and will not "cure" your chronic pain condition. They are meant to help you focus more on active exercises and cognitive skills as an adjunct along with conservative therapy.    However, injections and medications come with possible side effects. Some of these can be very serious.     Opioid pain medications are not prescribed by this clinic for chronic conditions that are not "palliative" (end of life pain). Opioids, including medications such as vicodin, percocet, morphine, can lead to dependence, tolerance, multiple organ side effects, addiction, and even death. The side effects are more dangerous than the condition being treated, and as such we do not recommend them for chronic non-pallliative pain.     We do not take over any opioid prescriptions from your current prescriber. If you are currently receiving these medications  from your doctor, you need to continue seeing your doctor and discussing appropriate prescribing and use. We do not get involved in such care unless requested by yourself or your doctor.       If you are prescribed any medication from this office, please take your medication only as directed and be careful using medications. Store them safely and make sure they are out of reach of children.    Please note that we are doing video encounters as part of our follow up appointments, where appropriate, due to COVID to protect  you. We will try contacting you first through a program called doximity. You will receive a text message. Please click on the link and then allow video and microphone access. This is temporary. If we cannot reach you through Doximity, we may try Facetime, [email or web address] or Skype. Please consider downloading a program such as Skype (found at [email or web address] or in the App stores) or [email or web address] at [email or web address] website.    Here are today's recommendations:    1. Physical therapy and active home exercise and stretches, to maintain or improve overall functional status. Some exercises may be listed below.     The purpose is to improve functioning, and they will make you sore until you get used to them. Treat this soreness with warm compresses, heat, warm showers or baths. This soreness is normal and expected. Do not perform an exercise that is causing severe pain.    2. You have been given exercises and stretches to do. Please do these daily, primarily to help with your functional capacity and activity. Do the exercises you can do. They can cause some mild soreness or discomfort, but avoid the ones that are causing excruciating pain. Do these exercises daily.     Use heat, warm compresses, hot showers, hot baths as needed for pain. Avoid painful activities when necessary.    4. Please consider exploring cognitive behavioral therapy to assist with managing pain and improving limitations towards better quality of life. We will provide a referral if you are willing to accept.    5.   Please get xrays done.    6. Please continue with chiropractic care    7. MRI scan ordered. Please schedule as soon as you are told it has been approved.    8. You received a dose of toradol today. Drink extra fluids to flush out your system, and avoid antiinflammatory medications today (such as ibuprofen or aleve)
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
## 2022-04-11 NOTE — Progress Notes (Signed)
Pt. Came in today for her 2nd Shingles vaccine, pt. Stated to Clinical research associate that she had an reaction to the last Shingles vaccine she had, pt. Stated that she had pain to all her toes and she could not put on any socks or any shoes for almost 2 months, pt. Stated that she then googled the reactions to the Shingles vaccine and she stated that that is one of the reactions. Writer gave this information to Dr. Imogene Burn who stated that she will leave the decision up to the pt. As we cannot guarantee that she will have any reactions to these vaccines, Dr. Imogene Burn stated that If the pt. Does not want the vaccine that's up to her, she did get the first one so she has some protection, Dr. Imogene Burn stated that I recommend that she get the vaccine but I am ok with it either way. Writer gave this information to the pt. And pt. Understood, pt. Stated that she will get the vaccine, Writer gave the 2nd vaccine Shingles vaccine in her left deltoid, tolerated well.

## 2022-07-18 ENCOUNTER — Emergency Department: Payer: Medicaid (Managed Care)

## 2022-07-18 ENCOUNTER — Emergency Department
Admission: EM | Admit: 2022-07-18 | Discharge: 2022-07-18 | Disposition: A | Discharge status: Home / Self Care | Payer: Medicaid (Managed Care) | Source: Ambulatory Visit | Attending: Emergency Medicine | Admitting: Emergency Medicine

## 2022-07-18 ENCOUNTER — Other Ambulatory Visit: Payer: Self-pay

## 2022-07-18 ENCOUNTER — Encounter: Payer: Self-pay | Admitting: Student in an Organized Health Care Education/Training Program

## 2022-07-18 DIAGNOSIS — F17210 Nicotine dependence, cigarettes, uncomplicated: Secondary | ICD-10-CM | POA: Insufficient documentation

## 2022-07-18 DIAGNOSIS — H539 Unspecified visual disturbance: Secondary | ICD-10-CM

## 2022-07-18 DIAGNOSIS — R110 Nausea: Secondary | ICD-10-CM | POA: Insufficient documentation

## 2022-07-18 DIAGNOSIS — G44201 Tension-type headache, unspecified, intractable: Secondary | ICD-10-CM | POA: Insufficient documentation

## 2022-07-18 DIAGNOSIS — R519 Headache, unspecified: Secondary | ICD-10-CM

## 2022-07-18 DIAGNOSIS — G9389 Other specified disorders of brain: Secondary | ICD-10-CM

## 2022-07-18 DIAGNOSIS — H53149 Visual discomfort, unspecified: Secondary | ICD-10-CM

## 2022-07-18 LAB — BASIC METABOLIC PANEL
Anion Gap: 12 (ref 7–16)
CO2: 21 mmol/L (ref 20–28)
Calcium: 8.2 mg/dL — ABNORMAL LOW (ref 8.6–10.2)
Chloride: 108 mmol/L (ref 96–108)
Creatinine: 0.68 mg/dL (ref 0.51–0.95)
Glucose: 94 mg/dL (ref 60–99)
Lab: 7 mg/dL (ref 6–20)
Potassium: 3.7 mmol/L (ref 3.3–5.1)
Sodium: 141 mmol/L (ref 133–145)
eGFR BY CREAT: 105 *

## 2022-07-18 LAB — CBC AND DIFFERENTIAL
Baso # K/uL: 0 10*3/uL (ref 0.0–0.2)
Basophil %: 0.3 %
Eos # K/uL: 0.1 10*3/uL (ref 0.0–0.5)
Eosinophil %: 1.3 %
Hematocrit: 28 % — ABNORMAL LOW (ref 34–49)
Hemoglobin: 10 g/dL — ABNORMAL LOW (ref 11.2–16.0)
IMM Granulocytes #: 0 10*3/uL (ref 0.0–0.0)
IMM Granulocytes: 0.2 %
Lymph # K/uL: 3.6 10*3/uL (ref 1.0–5.0)
Lymphocyte %: 39.7 %
MCH: 35 pg — ABNORMAL HIGH (ref 26–32)
MCHC: 35 g/dL (ref 32–36)
MCV: 99 fL (ref 75–100)
Mono # K/uL: 0.6 10*3/uL (ref 0.1–1.0)
Monocyte %: 6.6 %
Neut # K/uL: 4.7 10*3/uL (ref 1.5–6.5)
Nucl RBC # K/uL: 0 10*3/uL (ref 0.0–0.0)
Nucl RBC %: 0 /100 WBC (ref 0.0–0.2)
Platelets: 114 10*3/uL — ABNORMAL LOW (ref 150–450)
RBC: 2.9 MIL/uL — ABNORMAL LOW (ref 4.0–5.5)
RDW: 13.2 % (ref 0.0–15.0)
Seg Neut %: 51.9 %
WBC: 9 10*3/uL (ref 3.5–11.0)

## 2022-07-18 MED ORDER — SODIUM CHLORIDE 0.9 % FLUSH FOR PUMPS *I*
0.0000 mL/h | INTRAVENOUS | Status: DC | PRN
Start: 2022-07-18 — End: 2022-07-19

## 2022-07-18 MED ORDER — ACETAMINOPHEN 500 MG PO TABS *I*
1000.0000 mg | ORAL_TABLET | Freq: Once | ORAL | Status: AC
Start: 2022-07-18 — End: 2022-07-18
  Administered 2022-07-18: 1000 mg via ORAL
  Filled 2022-07-18: qty 2

## 2022-07-18 MED ORDER — DIPHENHYDRAMINE HCL 25 MG ORAL SOLID *WRAPPED*
25.0000 mg | Freq: Once | ORAL | Status: AC
Start: 2022-07-18 — End: 2022-07-18
  Administered 2022-07-18: 25 mg via ORAL
  Filled 2022-07-18: qty 1

## 2022-07-18 MED ORDER — KETOROLAC TROMETHAMINE 30 MG/ML IJ SOLN *I*
15.0000 mg | Freq: Once | INTRAMUSCULAR | Status: DC
Start: 2022-07-18 — End: 2022-07-18
  Filled 2022-07-18: qty 1

## 2022-07-18 MED ORDER — PROCHLORPERAZINE MALEATE 10 MG PO TABS *I*
5.0000 mg | ORAL_TABLET | Freq: Once | ORAL | Status: AC
Start: 2022-07-18 — End: 2022-07-18
  Administered 2022-07-18: 5 mg via ORAL
  Filled 2022-07-18: qty 1

## 2022-07-18 MED ORDER — DEXTROSE 5 % FLUSH FOR PUMPS *I*
0.0000 mL/h | INTRAVENOUS | Status: DC | PRN
Start: 2022-07-18 — End: 2022-07-19

## 2022-07-18 MED ORDER — SODIUM CHLORIDE 0.9 % IV BOLUS *I*
1000.0000 mL | Freq: Once | Status: AC
Start: 2022-07-18 — End: 2022-07-18
  Administered 2022-07-18: 1000 mL via INTRAVENOUS

## 2022-07-18 MED ORDER — ONDANSETRON HCL 2 MG/ML IV SOLN *I*
4.0000 mg | Freq: Once | INTRAMUSCULAR | Status: AC
Start: 2022-07-18 — End: 2022-07-18
  Administered 2022-07-18: 4 mg via INTRAVENOUS
  Filled 2022-07-18: qty 2

## 2022-07-18 MED ORDER — KETOROLAC TROMETHAMINE 30 MG/ML IJ SOLN *I*
15.0000 mg | Freq: Once | INTRAMUSCULAR | Status: AC
Start: 2022-07-18 — End: 2022-07-18
  Administered 2022-07-18: 15 mg via INTRAVENOUS

## 2022-07-18 NOTE — ED Triage Notes (Signed)
Pt reports headache x3 days w/ spots. +blurry vision,+photosensitivity. Has taken ibuprofen w/ no relief.     Prehospital medications given: No

## 2022-07-18 NOTE — Discharge Instructions (Addendum)
You were seen and evaluated in the emergency department today for headache. You had CT scan which showed a low density area of the brain, but no emergent concerns for stroke at this time.    Recommend Tylenol and ibuprofen for pain control.     Please continue to take all home medications as previously prescribed.    Please call your primary care doctor and notify them that you were seen in the emergency department. Please follow-up with them in 3-5 days for re-evaluation.    Return to the emergency department if you experience worsening headache, blurry vision, weakness, numbness and tingling, word slurring, word finding difficulties, or any other concerning symptoms.

## 2022-07-18 NOTE — ED Provider Notes (Signed)
History     Chief Complaint   Patient presents with    Headache     Martha Ayala is a 50 female with PMH notable for HTN, and self-reported migraine headaches presenting today with headache for the past 3 days.  Admits to a throbbing bilateral frontal ache.  She has tried Tylenol and oxycodone with minimal relief of pain at home.  Minimal sleep and p.o. intake for the past 3 days due to pain.  Admits to blurry vision, orange and black spots, photophobia and nausea.  Denies weakness, LOC, numbness tingling, chest pain, dyspnea, abdominal pain, vomiting.  She states pain is similar to migraine headaches in the past.      History provided by:  Patient  Language interpreter used: No              Medical/Surgical/Family History     History reviewed. No pertinent past medical history.     There is no problem list on file for this patient.           History reviewed. No pertinent surgical history.       Social History     Tobacco Use    Smoking status: Every Day     Types: Cigarettes    Smokeless tobacco: Never             Review of Systems   Constitutional:  Negative for appetite change, diaphoresis, fatigue and fever.   HENT:  Negative for sinus pressure, sinus pain and trouble swallowing.    Eyes:  Positive for photophobia and visual disturbance.   Respiratory:  Negative for cough and shortness of breath.    Cardiovascular:  Negative for chest pain, palpitations and leg swelling.   Gastrointestinal:  Positive for nausea. Negative for abdominal pain, constipation, diarrhea and vomiting.   Musculoskeletal:  Negative for back pain and gait problem.   Skin:  Negative for color change and pallor.   Neurological:  Positive for headaches. Negative for dizziness, tremors, syncope, facial asymmetry, speech difficulty, weakness, light-headedness and numbness.       Physical Exam     Triage Vitals  Triage Start: Start, (07/18/22 1413)  First Recorded BP: 132/79, Resp: 16, Temp: 36.1 C (97 F) Oxygen Therapy SpO2: 98 %, O2 Device:  None (Room air), Heart Rate: 77, (07/18/22 1413)  .  First Pain Reported  0-10 Scale: 10, (07/18/22 1413)       Physical Exam  Vitals and nursing note reviewed.   Constitutional:       General: She is awake. She is not in acute distress.     Appearance: She is not ill-appearing, toxic-appearing or diaphoretic.   HENT:      Head: Normocephalic and atraumatic.   Eyes:      General: No scleral icterus.        Right eye: No discharge.         Left eye: No discharge.      Extraocular Movements: Extraocular movements intact.      Pupils: Pupils are equal, round, and reactive to light.      Comments: Notable irritaiton with light accomodation    Cardiovascular:      Rate and Rhythm: Normal rate and regular rhythm.      Heart sounds: No murmur heard.     No friction rub. No gallop.   Pulmonary:      Effort: No respiratory distress.      Breath sounds: No wheezing, rhonchi or rales.   Chest:  Chest wall: No tenderness.   Abdominal:      General: There is no distension.      Tenderness: There is no abdominal tenderness. There is no right CVA tenderness, left CVA tenderness or guarding.   Musculoskeletal:      Right lower leg: No edema.      Left lower leg: No edema.   Skin:     Capillary Refill: Capillary refill takes less than 2 seconds.   Neurological:      Mental Status: She is alert.      Comments: Neurological Examination:  Mental Status: Awake and alert. Oriented to person, place, and time. Affect appropriate.  Cranial Nerves:       II: Discs sharp, pupils 3/3 to 2/2    III/IV/VI: Versions intact without nystagmus, no gaze preference    V: Facial sensation symmetric to light touch    VII: Facial expression symmetric    VIII: Hearing intact to voice    IX/X: Palate elevates symmetrically    XI: Shoulder shrug symmetric    XII: Tongue midline  Motor: Bulk, tone, and strength were normal throughout. Pronator drift was absent. There were no abnormal movements.  Sensory: Sensation to light touch intact.   Coordination:  Finger to nose and heel to shin were intact  Gait: Narrow based and normal.    Psychiatric:         Mood and Affect: Mood normal.         Behavior: Behavior normal. Behavior is cooperative.         Thought Content: Thought content normal.         Judgment: Judgment normal.           Medical Decision Making   Patient seen by me on:  07/18/2022    Assessment:  Martha Ayala is a 34 female with PMH notable for HTN, and self-reported migraine headaches presenting today with headache for the past 3 days.  Admits to a throbbing bilateral frontal ache.  She has tried Tylenol and oxycodone with minimal relief of pain at home.  Minimal sleep and p.o. intake for the past 3 days due to pain.  Admits to blurry vision, orange and black spots, photophobia and nausea.  Denies weakness, LOC, numbness tingling, chest pain, dyspnea, abdominal pain, vomiting.  On exam, cranial nerves II through XII intact, no focal sensory or motor deficits.  Mild dizziness upon standing but gait stable.    Differential diagnosis:  Migrinae headache,electrolyte abnormality, anemia, ICH, intracranial mass  Less likely CVA as no focal findings      Plan:  Medications  sodium chloride 0.9 % bolus 1,000 mL (1,000 mLs Intravenous New Bag 07/18/22 1804)  sodium chloride 0.9 % FLUSH REQUIRED IF PATIENT HAS IV (has no administration in time range)  dextrose 5 % FLUSH REQUIRED IF PATIENT HAS IV (has no administration in time range)  ondansetron (ZOFRAN) injection 4 mg (has no administration in time range)  acetaminophen (TYLENOL) tablet 1,000 mg (1,000 mg Oral Given 07/18/22 1745)  diphenhydrAMINE (BENADRYL) oral solid 25 mg (25 mg Oral Given 07/18/22 1818)  prochlorperazine (COMPAZINE) tablet 5 mg (5 mg Oral Given 07/18/22 1817)  ketorolac (TORADOL) 30 mg/mL injection 15 mg (15 mg Intravenous Given 07/18/22 1816)     Orders Placed This Encounter      CBC and differential      Basic metabolic panel      Insert peripheral IV       Review of existing & external  labs / records:  yes, previous records    Consideration of hospitalization: not at this time    Other MDM elements: Reviewed patient with Vincenza Hews, NP    ED Course and Disposition:  Hematocrit 28.  Does admit to lightheadedness with ambulation.  Still ambulating without assistance.  She states she has been diagnosed with anemia in the past.  Lab work 1 year ago with hematocrit of 36.  Minimal improvement with migraine cocktail.  Will order CT head    Update:9:13 PM  CT head with Encephalomalacia  Patient still endorsing headache with minimal improvement since arrival.  Discussed with patient about performing EKG and starting droperidol.  Patient declined further intervention.    We will discharge patient home with urgent PCP follow-up  Recommended Tylenol, ibuprofen for pain control  Return precautions provided to patient including: Worsening numbness and tingling, worsening headache, blurry vision, weakness, word slurring, any other concerning findings.  Joselyn Glassman understands agrees with this plan           ED Course as of 07/18/22 1954   Thu Jul 18, 2022   1905 Glucose: 94   1905 Sodium: 141   1905 Potassium: 3.7   1905 Chloride: 108   1905 CO2: 21   1905 Anion Gap: 12   1905 UN: 7   1905 Creatinine: 0.68   1905 eGFR BY CREAT: 105   1925 WBC: 9.0   1925 RBC(!): 2.9   1925 Hemoglobin(!): 10.0   1925 Hematocrit(!): [address] [place]          Author:  Edmonia Caprio, PA       [place], [place], [place]  07/18/22 2147
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
## 2022-07-18 NOTE — First Provider Contact (Signed)
ED First Provider Contact Note    Initial provider evaluation performed by   ED First Provider Contact       Date/Time Event User Comments    07/18/22 1416 ED First Provider Contact Darrold Span, Corliss Coggeshall CRUZ Initial Face to Face Provider Contact            Vital signs reviewed.    Assessment: 50F with self reported history of migraines, presenting for headache described as a migraine for the past 3 days with associated vision spots, photophobia, and blurred vision. Tried ibuprofen at home without relief. No numbness, weakness, vision loss.     Exam:  Moving all extremities  Sensation grossly intact  Vision grossly intact    Orders placed:  ANALGESIA     Patient requires further evaluation.     Khambrel Amsden Ailene Rud, MD, 07/18/2022, 2:16 PM     Marijean Niemann, MD  Resident  07/18/22 [phone]

## 2022-08-07 LAB — UNMAPPED LAB RESULTS
HIV 1&2 ANTIGEN/ANTIBODY (HT): NONREACTIVE
Hematocrit (HT): 39 % (ref 34–47)
Hemoglobin (HGB) (HT): 13.6 g/dL (ref 11.5–16.0)
Hep C Ab: NEGATIVE
MCHC (HT): 34.6 g/dL (ref 32.0–36.0)
MCV (HT): 99.2 fL — ABNORMAL HIGH (ref 81.0–99.0)
Mean Corpuscular Hemoglobin (MCH) (HT): 34.3 pg — ABNORMAL HIGH (ref 26.0–34.0)
Platelets (HT): 134 10 3/uL — ABNORMAL LOW (ref 140–400)
RBC (HT): 3.96 10 6/uL (ref 3.80–5.20)
RDW (HT): 12.3 % (ref 11.5–15.0)
WBC (HT): 7.5 10 3/uL (ref 4.0–10.8)

## 2022-08-11 DIAGNOSIS — K219 Gastro-esophageal reflux disease without esophagitis: Secondary | ICD-10-CM | POA: Insufficient documentation

## 2022-08-11 LAB — UNMAPPED LAB RESULTS
Basophil # (HT): 0 10 3/uL (ref 0.0–0.2)
Basophil % (HT): 0 % (ref 0–3)
Eosinophil # (HT): 0.1 10 3/uL (ref 0.0–0.6)
Eosinophil % (HT): 1 % (ref 0–5)
Hematocrit (HT): 39 % (ref 35–47)
Hemoglobin (HGB) (HT): 13.9 g/dL (ref 12.0–16.0)
Lymphocyte # (HT): 2.7 10 3/uL (ref 1.0–4.8)
Lymphocyte % (HT): 33 % (ref 15–45)
MCHC (HT): 35.5 g/dL (ref 31.0–37.5)
MCV (HT): 97 fL (ref 80–100)
Mean Corpuscular Hemoglobin (MCH) (HT): 34.4 pg — ABNORMAL HIGH (ref 26.0–34.0)
Monocyte # (HT): 0.5 10 3/uL (ref 0.1–1.0)
Monocyte % (HT): 7 % (ref 0–15)
Neutrophil # (HT): 4.7 10 3/uL (ref 1.8–8.0)
Platelets (HT): 124 10 3/uL — ABNORMAL LOW (ref 150–450)
RBC (HT): 4.04 10 6/uL (ref 3.80–5.20)
RDW (HT): 12.7 % (ref 0.0–15.2)
Seg Neut % (HT): 59 % (ref 45–75)
WBC (HT): 8.1 10 3/uL (ref 4.0–11.0)

## 2022-08-12 LAB — UNMAPPED LAB RESULTS
Basophil # (HT): 0 10 3/uL (ref 0.0–0.2)
Basophil % (HT): 1 % (ref 0–3)
Eosinophil # (HT): 0.1 10 3/uL (ref 0.0–0.6)
Eosinophil % (HT): 2 % (ref 0–5)
Hematocrit (HT): 38 % (ref 35–47)
Hemoglobin (HGB) (HT): 13.2 g/dL (ref 12.0–16.0)
Lymphocyte # (HT): 2.7 10 3/uL (ref 1.0–4.8)
Lymphocyte % (HT): 47 % — ABNORMAL HIGH (ref 15–45)
MCHC (HT): 35 g/dL (ref 31.0–37.5)
MCV (HT): 99 fL (ref 80–100)
Mean Corpuscular Hemoglobin (MCH) (HT): 34.6 pg — ABNORMAL HIGH (ref 26.0–34.0)
Monocyte # (HT): 0.4 10 3/uL (ref 0.1–1.0)
Monocyte % (HT): 7 % (ref 0–15)
Neutrophil # (HT): 2.6 10 3/uL (ref 1.8–8.0)
Platelets (HT): 110 10 3/uL — ABNORMAL LOW (ref 150–450)
RBC (HT): 3.82 10 6/uL (ref 3.80–5.20)
RDW (HT): 12.9 % (ref 0.0–15.2)
Seg Neut % (HT): 44 % — ABNORMAL LOW (ref 45–75)
WBC (HT): 5.8 10 3/uL (ref 4.0–11.0)

## 2022-11-19 DIAGNOSIS — G4489 Other headache syndrome: Secondary | ICD-10-CM | POA: Insufficient documentation

## 2023-03-26 ENCOUNTER — Other Ambulatory Visit: Payer: Self-pay

## 2023-03-26 ENCOUNTER — Ambulatory Visit
Admission: RE | Admit: 2023-03-26 | Discharge: 2023-03-26 | Disposition: A | Discharge status: Home / Self Care | Payer: Medicaid Other | Source: Ambulatory Visit

## 2023-03-26 ENCOUNTER — Ambulatory Visit: Payer: Medicaid Other

## 2023-03-26 ENCOUNTER — Ambulatory Visit: Payer: Medicaid Other | Attending: Family Medicine | Admitting: Family Medicine

## 2023-03-26 VITALS — BP 123/92 | HR 89 | Temp 97.9°F | Resp 16

## 2023-03-26 DIAGNOSIS — R109 Unspecified abdominal pain: Secondary | ICD-10-CM | POA: Insufficient documentation

## 2023-03-26 DIAGNOSIS — R079 Chest pain, unspecified: Secondary | ICD-10-CM | POA: Insufficient documentation

## 2023-03-26 DIAGNOSIS — Z789 Other specified health status: Secondary | ICD-10-CM | POA: Insufficient documentation

## 2023-03-26 DIAGNOSIS — R9431 Abnormal electrocardiogram [ECG] [EKG]: Secondary | ICD-10-CM

## 2023-03-26 DIAGNOSIS — M545 Low back pain, unspecified: Secondary | ICD-10-CM | POA: Insufficient documentation

## 2023-03-26 LAB — POCT URINALYSIS DIPSTICK
Bilirubin,Ur: NEGATIVE
Glucose,UA POCT: NORMAL mg/dL
Ketones,UA POCT: NEGATIVE mg/dL
Lot #: 73386502
Nitrite,UA POCT: NEGATIVE
PH,UA POCT: 6 (ref 5–8)
Specific gravity,UA POCT: 1.015 (ref 1.002–1.030)
Urobilinogen,UA: NORMAL mg/dL

## 2023-03-26 LAB — EKG 12-LEAD
P: 37 deg
PR: 141 ms
QRS: 28 deg
QRSD: 92 ms
QT: 393 ms
QTc: 412 ms
Rate: 66 {beats}/min
T: 42 deg

## 2023-03-26 NOTE — Patient Instructions (Signed)
We discussed your abnormal chest x-ray.  This requires further evaluation.  We have not found a source of your pain.  Possible diagnoses include kidney stones, heart issues, lung mass (tumor) or lung blood clot.  You have agreed to go to the emergency department for further evaluation of your pain and of your abnormal chest x-ray.  Go directly to [facility] emergency department for further evaluation.

## 2023-03-26 NOTE — UC Provider Note (Signed)
History     Chief Complaint   Patient presents with   . Back Pain     Upper back pain beginning 2-3 days ago, feels tightness in her rib cage and difficulty taking a deep breath d/t pain. No known injury. Has not tried anything for symptoms.      Patient is a 52 year old female with past medical history of GERD, hypertension and chronic mixed headache syndrome who presents with 2 to 3-day history of continuous dull pain with intermittent sharp left flank pain patient reports she feels a tightness under her rib cage and pain is worsened with deep respiration.  No shortness of breath.  No anterior left chest pain.  No nausea or vomiting.  No fever or chills.  No lower extremity edema or calf tenderness.  2 weeks ago patient did roundtrip flight to [place].  No  long car trips.normal bowel movements without bright red blood per rectum.  Patient denies any dysuria hematuria urinary frequency or other abnormal urinary symptoms.  No symptom relieving methods or medications.       Medical/Surgical/Family History     History reviewed. No pertinent past medical history.     There is no problem list on file for this patient.           History reviewed. No pertinent surgical history.  History reviewed. No pertinent family history.       Social History     Tobacco Use   . Smoking status: Every Day     Types: Cigarettes   . Smokeless tobacco: Never     Living Situation       Questions Responses    Patient lives with     Homeless     Caregiver for other family member     External Services     Employment     Domestic Violence Risk                   Review of Systems   Review of Systems    Physical Exam   Vitals     First Recorded BP: (!) 123/92, Resp: 16, Temp: 36.6 C (97.9 F) Oxygen Therapy SpO2: 98 %, Heart Rate: 89, (03/26/23 1718)  .      Physical Exam  Vitals and nursing note reviewed.   Constitutional:       General: She is not in acute distress.     Appearance: She is well-developed.      Comments: Uncomfortable  appearing due to pain   HENT:      Head: Normocephalic and atraumatic.   Eyes:      Conjunctiva/sclera: Conjunctivae normal.   Pulmonary:      Effort: Pulmonary effort is normal.      Breath sounds: Normal breath sounds. No wheezing or rhonchi.      Comments: Excellent air movement  Chest:      Chest wall: No tenderness.   Abdominal:      General: Abdomen is flat.      Palpations: Abdomen is soft.      Tenderness: There is no right CVA tenderness, left CVA tenderness, guarding or rebound.   Musculoskeletal:      Comments: No lower extremity edema or calf tenderness   Skin:     General: Skin is warm and dry.   Neurological:      Mental Status: She is alert and oriented to person, place, and time.   Psychiatric:  Behavior: Behavior normal.         Thought Content: Thought content normal.         Judgment: Judgment normal.        Medical Decision Making   Medical Decision Making  Assessment:    Patient is a 52 year old female with past medical history of GERD, hypertension and chronic mixed headache syndrome who presents with 2 to 3-day history of continuous dull pain with intermittent sharp left flank pain patient reports she feels a tightness under her rib cage and pain is worsened with deep respiration.  No shortness of breath.  No anterior left chest pain.  No nausea or vomiting.  No fever or chills.  No lower extremity edema or calf tenderness.  2 weeks ago patient did roundtrip flight to [place].  No  long car trips.normal bowel movements without bright red blood per rectum.  Patient denies any dysuria hematuria urinary frequency or other abnormal urinary symptoms.  No symptom relieving methods or medications.     Exam as detailed above    Differential diagnosis:    Urolithiasis  PE  Lung mass  Pneumothorax  Cardiac chest pain    Plan and Results:    Discussed abnormal chest x-ray with patient and stressed importance of need for follow-up.  I also stressed the importance of need for further evaluation and  discussed above differential diagnosis at length.  Patient is agreeable to further evaluation at [facility] emergency department.  She reports that she wants to stop home and attend to her children prior to going to the emergency department I strongly encourage patient to go to the emergency department directly.  We discussed your abnormal chest x-ray.  This requires further evaluation.  We have not found a source of your pain.  Possible diagnoses include kidney stones, heart issues, lung mass (tumor) or lung blood clot.  You have agreed to go to the emergency department for further evaluation of your pain and of your abnormal chest x-ray.  Go directly to [facility] emergency department for further evaluation.  Encounter orders    Orders Placed This Encounter      Aerobic culture      * Abdomen standard AP single view/KUB      *Chest standard frontal and lateral views      POCT urinalysis dipstick      EKG 12 lead    Lab results   Recent Results (from the past 24 hour(s))  -POCT urinalysis dipstick:   Collection Time: 03/26/23  5:32 PM       Result                                            Value                                                    Specific gravity,UA POCT                          1.015  PH,UA POCT                                        6.0                                                      Leuk Esterase,UA POCT                             Trace (!)                                                Nitrite,UA POCT                                   Negative                                                 Protein,UA POCT                                   Trace (!)                                                Glucose,UA POCT                                   Normal                                                   Ketones,UA POCT                                   Negative                                                 Urobilinogen,UA                                    Normal  Bilirubin,Ur                                      Negative                                                 Blood,UA POCT                                     About 50 (!)                                             Exp date                                          12/24                                                    Lot #                                             [id number]                                              XRay results  * Abdomen standard AP single view/KUB    Result Date: 03/26/2023  03/26/2023 6:24 PM ABDOMEN RADIOGRAPH CLINICAL INFORMATION: left flank pain, R10.9-Unspecified abdominal painR07.9-Chest pain, unspecified. COMPARISON: None. PROCEDURE: A single projection of the abdomen was obtained. FINDINGS/    No definite renal calculi. Nonobstructive bowel gas pattern. No acute osseous abnormalities. The included chest base is unremarkable. END OF IMPRESSION I have personally reviewed the images and the Resident's/Fellow's interpretation and agree with or edited the findings.       [facility] submits this DICOM format image data and final report to the [facility], an independent secure electronic health information exchange, on a reciprocally searchable basis (with patient authorization) for a minimum of 12 months after exam date.    *Chest standard frontal and lateral views    Result Date: 03/26/2023  03/26/2023 6:24 PM CHEST X-RAYS CLINICAL INFORMATION: left chest pain, R10.9-Unspecified abdominal painR07.9-Chest pain, unspecified. COMPARISON: None. PROCEDURE: Frontal and lateral projections of the chest were obtained. FINDINGS: The cardiomediastinal silhouette is unremarkable in size and contour. Bilateral costophrenic angles are sharp. 6 mm density overlies the anterior portion of the left fourth rib, partly obscured. No focal airspace opacity, pulmonary vascular congestion, or pneumothorax. Mild degenerative change  throughout the thoracic spine.     No acute cardiopulmonary disease. A 6 mm density over the anterior portion of the left fourth rib anteriorly may  reflect a nodule or artifact. Recommend repeat frontal view of the chest with symptoms improve to reassess. Communication of the above results to the referring clinician or covering provider is documented in the [facility] PACS communication system. END OF IMPRESSION I have personally reviewed the images and the Resident's/Fellow's interpretation and agree with or edited the findings.       [facility] submits this DICOM format image data and final report to the [facility], an independent secure electronic health information exchange, on a reciprocally searchable basis (with patient authorization) for a minimum of 12 months after exam date.      EKG Interpretation:    Normal sinus rhythm no acute abnormalities unchanged from 2023 care everywhere ED record EKG.  Normal sinus rhythm and No ischemic changes      Independent Review of: XRays this encounter and chart/prior records    Diagnosis and Disposition:       Return precautions discussed and provided on AVS.    Discharge Medications              Final Diagnosis    ICD-10-CM ICD-9-CM   1. Low back pain  M54.50 724.2         Alcus Dad, MD          Author:  Alcus Dad, MD

## 2023-03-27 LAB — AEROBIC CULTURE: Aerobic Culture: 0

## 2023-07-01 LAB — UNMAPPED LAB RESULTS
Hematocrit (HT): 35 % (ref 34–47)
Hemoglobin (HGB) (HT): 11.8 g/dL (ref 11.5–16.0)
MCHC (HT): 34.1 g/dL (ref 32.0–36.0)
MCV (HT): 100.3 fL — ABNORMAL HIGH (ref 81.0–99.0)
Mean Corpuscular Hemoglobin (MCH) (HT): 34.2 pg — ABNORMAL HIGH (ref 26.0–34.0)
Platelets (HT): 118 10 3/uL — ABNORMAL LOW (ref 150–450)
RBC (HT): 3.45 10 6/uL — ABNORMAL LOW (ref 3.80–5.20)
RDW (HT): 12.8 % (ref 11.5–15.0)
WBC (HT): 6.7 10 3/uL (ref 4.0–10.8)

## 2023-07-07 ENCOUNTER — Other Ambulatory Visit: Payer: Self-pay

## 2023-07-07 ENCOUNTER — Encounter: Payer: Self-pay | Admitting: Emergency Medicine

## 2023-07-07 ENCOUNTER — Ambulatory Visit: Payer: Medicaid Other | Attending: Emergency Medicine | Admitting: Emergency Medicine

## 2023-07-07 VITALS — BP 144/97 | HR 73 | Temp 97.0°F | Resp 18

## 2023-07-07 DIAGNOSIS — R079 Chest pain, unspecified: Secondary | ICD-10-CM | POA: Insufficient documentation

## 2023-07-07 DIAGNOSIS — M75101 Unspecified rotator cuff tear or rupture of right shoulder, not specified as traumatic: Secondary | ICD-10-CM | POA: Insufficient documentation

## 2023-07-07 DIAGNOSIS — M545 Low back pain, unspecified: Secondary | ICD-10-CM | POA: Insufficient documentation

## 2023-07-07 DIAGNOSIS — Z789 Other specified health status: Secondary | ICD-10-CM | POA: Insufficient documentation

## 2023-07-07 DIAGNOSIS — R0789 Other chest pain: Secondary | ICD-10-CM | POA: Insufficient documentation

## 2023-07-07 DIAGNOSIS — M542 Cervicalgia: Secondary | ICD-10-CM | POA: Insufficient documentation

## 2023-07-07 DIAGNOSIS — G8929 Other chronic pain: Secondary | ICD-10-CM | POA: Insufficient documentation

## 2023-07-07 MED ORDER — ASPIRIN 81 MG PO CHEW *I*
324.0000 mg | CHEWABLE_TABLET | Freq: Once | ORAL | Status: AC
Start: 2023-07-07 — End: 2023-07-07
  Administered 2023-07-07: 324 mg via ORAL
  Filled 2023-07-07: qty 4

## 2023-07-07 NOTE — UC Provider Note (Signed)
History     Chief Complaint   Patient presents with    Chest Pain     Chest pain onset 2 hours ago under left breast. Denies SOB, recent illness, cough. 3 "mild strokes" since January.     52yof with severe left side chest pain behind breast. Started out of no where 2 hours ago. Getting worse. Has history of 3 mild strokes over the past 1 years. Feels the need to take deep breaths but no shortness of breath. Does not hurt to move      History provided by:  Patient  Language interpreter used: No    Chest Pain  Associated symptoms: no fatigue and no shortness of breath        Medical/Surgical/Family History     History reviewed. No pertinent past medical history.     Patient Active Problem List   Diagnosis Code    Acute midline low back pain without sciatica M54.50    Cervicalgia M54.2    Essential hypertension I10    GERD (gastroesophageal reflux disease) K21.9    Chronic mixed headache syndrome G44.89    Other chronic pain G89.29    Rotator cuff syndrome of right shoulder M75.101            History reviewed. No pertinent surgical history.  History reviewed. No pertinent family history.       Social History     Tobacco Use    Smoking status: Every Day     Types: Cigarettes    Smokeless tobacco: Never     Living Situation       Questions Responses    Patient lives with     Homeless     Caregiver for other family member     External Services     Employment     Domestic Violence Risk                   Review of Systems   Review of Systems   Constitutional:  Negative for fatigue.   HENT:  Negative for congestion.    Respiratory:  Negative for shortness of breath.    Cardiovascular:  Positive for chest pain.   Musculoskeletal:  Negative for arthralgias and gait problem.   Skin:  Negative for color change.       Physical Exam   Vitals     First Recorded BP: (!) 144/97, Resp: 18, Temp: 36.1 C (97 F), Temp src: TEMPORAL Oxygen Therapy SpO2: 99 %, Heart Rate: 73, (07/07/23 1424)  .      Physical Exam  Vitals and nursing  note reviewed.   Constitutional:       General: Distressed: appears in pain.Marland Kitchen      Appearance: Normal appearance.   HENT:      Head: Normocephalic and atraumatic.      Right Ear: External ear normal.      Left Ear: External ear normal.      Nose: Nose normal.      Mouth/Throat:      Mouth: Mucous membranes are moist.   Eyes:      Conjunctiva/sclera: Conjunctivae normal.   Cardiovascular:      Rate and Rhythm: Normal rate and regular rhythm.   Pulmonary:      Effort: Pulmonary effort is normal.      Breath sounds: Normal breath sounds.   Chest:      Chest wall: No tenderness.   Musculoskeletal:      Cervical back:  Normal range of motion.   Skin:     General: Skin is warm and dry.   Neurological:      Mental Status: She is alert.          Medical Decision Making   Medical Decision Making  Assessment:    52yof with left side chest pain. Does not hurt to touch. Severe per pt    Differential diagnosis:    Acs  Mi  nstemi    Plan and Results:    Go to the ED  Pt agreeable to ems transfer        EKG Interpretation:    Nsr 75. No obvious st changes      Diagnosis and Disposition:   I have reviewed all labs and discharge instructions with the patient. I have answered all questions to the best of my knowledge. Patient/caregiver verbalizes understanding and is agreeable to discharge.    [employer] engineer was used for part/all of this encounter. Errors in grammar were changed and fixed to the best of my ability.     Orders Placed This Encounter      EKG 12 lead      aspirin chewable tablet 324 mg      No results found for this or any previous visit (from the past 24 hours).      Final Diagnosis  (R07.9) Chest pain, unspecified type  (primary encounter diagnosis)    Encourage fluids, encourage rest, good hand hygiene.    Use over the counter medications as discussed.    Please start the new medications as below:    Patient's Medications    New Prescriptions  No medications on file    Previous  Medications  ALBUTEROL HFA (PROVENTIL, VENTOLIN) 108 (90 BASE) MCG/ACT INHALER     Inhale 2 puffs into the lungs 2 times daily as needed.  AMITRIPTYLINE (ELAVIL) 75 MG TABLET     Take 1 tablet (75 mg total) by mouth nightly.  AMLODIPINE 10 MG TABLET     Take 1 tablet (10 mg total) by mouth daily.  ATORVASTATIN 40 MG TABLET     Take 1 tablet (40 mg total) by mouth daily.  SUMATRIPTAN (IMITREX) 25 MG TABLET     SMARTSIG:By Mouth    Modified Medications  No medications on file  Discontinued Medications  No medications on file    Please follow up with your physician as below:        Thank you Altheda Steiniger for coming to [facility] for your health care concerns.    If your condition changes and/or worsens please follow up with her primary doctor and/or return to the urgent care center.    If short of breath, chest pains or any other concerns please report to the emergency room.    In the event of an Emergency dial 911.          Final Diagnosis    ICD-10-CM ICD-9-CM   1. Chest pain, unspecified type  R07.9 786.50         Maudry Mayhew, [place]          Author:  Maudry Mayhew, [place]

## 2023-07-07 NOTE — Nursing Note (Signed)
NPN: Pt given Asprin chewable (324 mg) at 1443 prior to EMS transfer.    Charlena Cross, RN  [facility] [place]

## 2023-07-07 NOTE — Patient Instructions (Signed)
Go to the ED

## 2023-07-16 LAB — EKG 12-LEAD
P: 46 deg
PR: 143 ms
QRS: 24 deg
QRSD: 84 ms
QT: 376 ms
QTc: 419 ms
Rate: 75 {beats}/min
T: 15 deg

## 2023-12-29 LAB — UNMAPPED LAB RESULTS
Hematocrit (HT): 32 % — ABNORMAL LOW (ref 34–47)
Hemoglobin (HGB) (HT): 11 g/dL — ABNORMAL LOW (ref 11.5–16.0)
MCHC (HT): 34.1 g/dL (ref 32.0–36.0)
MCV (HT): 99.4 fL — ABNORMAL HIGH (ref 81.0–99.0)
Mean Corpuscular Hemoglobin (MCH) (HT): 33.8 pg (ref 26.0–34.0)
Platelets (HT): 130 10 3/uL — ABNORMAL LOW (ref 150–450)
RBC (HT): 3.25 10 6/uL — ABNORMAL LOW (ref 3.80–5.20)
RDW (HT): 12.5 % (ref 11.5–15.0)
WBC (HT): 6.5 10 3/uL (ref 4.0–10.8)

## 2024-08-12 ENCOUNTER — Ambulatory Visit: Attending: Cardiology | Admitting: Emergency Medicine

## 2024-08-12 ENCOUNTER — Other Ambulatory Visit: Payer: Self-pay

## 2024-08-12 ENCOUNTER — Encounter: Payer: Self-pay | Admitting: Emergency Medicine

## 2024-08-12 DIAGNOSIS — R519 Headache, unspecified: Secondary | ICD-10-CM | POA: Insufficient documentation

## 2024-08-12 DIAGNOSIS — M79602 Pain in left arm: Secondary | ICD-10-CM | POA: Insufficient documentation

## 2024-08-12 DIAGNOSIS — R21 Rash and other nonspecific skin eruption: Secondary | ICD-10-CM | POA: Insufficient documentation

## 2024-08-12 DIAGNOSIS — Z789 Other specified health status: Secondary | ICD-10-CM | POA: Insufficient documentation

## 2024-08-12 DIAGNOSIS — R9431 Abnormal electrocardiogram [ECG] [EKG]: Secondary | ICD-10-CM | POA: Insufficient documentation

## 2024-08-12 DIAGNOSIS — R079 Chest pain, unspecified: Secondary | ICD-10-CM | POA: Insufficient documentation

## 2024-08-12 LAB — POCT GLUCOSE: Glucose POCT: 117 mg/dL — ABNORMAL HIGH (ref 60–99)

## 2024-08-12 MED ORDER — CLOTRIMAZOLE 1 % EX CREA *I*
TOPICAL_CREAM | Freq: Two times a day (BID) | CUTANEOUS | 0 refills | Status: AC
Start: 2024-08-12 — End: ?

## 2024-08-12 NOTE — UC Provider Note (Signed)
 History Chief Complaint Patient presents with  Migraine   Migraine started yesterday when she smoke a cigarette she experienced new left upper extremity pain, numbness, weakness, and tingling. Has a history of migraines uses imatrex, has not taken it yet.  DENIES visual changes, chest pain, shortness of breath,  The patient is a 53 year old female with past medical history of hypertension and hyperlipidemia presenting to urgent care with complaints of migraine headaches.  States that ever since she got COVID, she has had migraine headaches.  Has been seen twice by neurology.  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  Patient states that the migraines have become more intense and frequent in nature.  States that over the past week she has had a migraine with associated chest pain and left arm pain and numbness.  States symptoms have been waxing and waning.  This morning she smoked a cigarette which triggered her headache, chest pain, and left arm pain and numbness.  States that when she stopped smoking, symptoms improved, but have not fully gone away.  Endorses a 5/10 headache.  States that at times the headache is so bad she has trouble focusing and has difficulty with her vision.  Patient states that her fianc passed away earlier this year from a stroke.  Reports strong family history of coronary artery disease.  Patient is perimenopausal.  Patient also complains of a rash underneath her breasts that she would also like evaluated.  Patient denies any facial paralysis, speech changes, palpitations, shortness of breath, leg swelling, difficulty with speech, nausea, vomiting.  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 Eyes:  Positive for visual disturbance. Respiratory:  Negative for cough and shortness of breath.  Cardiovascular:  Positive for chest pain. Negative for palpitations and leg swelling. Gastrointestinal:  Negative for abdominal pain, diarrhea, nausea and vomiting. Musculoskeletal:  Positive for myalgias. Neurological:  Positive for dizziness, light-headedness, numbness and headaches. Psychiatric/Behavioral:  Positive for decreased concentration. Negative for confusion.  Physical Exam Vitals   First Recorded BP: 122/87, Resp: 18, Temp: 36.5 C (97.7 F), Temp src: TEMPORAL Oxygen Therapy SpO2: 100 %, Heart Rate: 76, (08/12/24 1808)  .Physical ExamVitals and nursing note reviewed. Constitutional:     General: She is not in acute distress.   Appearance: She is well-developed. HENT:    Head: Normocephalic and atraumatic.    Right Ear: External ear normal.    Left Ear: External ear normal.    Nose: Nose normal.    Mouth/Throat:    Mouth: Mucous membranes are moist.    Pharynx: Oropharynx is clear. Eyes:    Extraocular Movements: Extraocular movements intact.    Conjunctiva/sclera: Conjunctivae normal.    Pupils: Pupils are equal, round, and reactive to light. Neck:  Trachea: No tracheal deviation. Cardiovascular:    Rate and Rhythm:  Normal rate and regular rhythm.    Heart sounds: Normal heart sounds. Pulmonary:    Effort: Pulmonary effort is normal. No respiratory distress.    Breath sounds: Normal breath sounds. No stridor. Musculoskeletal:    Cervical back: Normal range of motion and neck supple. Lymphadenopathy:    Cervical: No cervical adenopathy. Skin:   General: Skin is warm and dry.    Findings: Rash present. Rash is scaling.       Comments: Erythematous scaling plaques under both breasts. Neurological:    Mental Status: She is alert and oriented to person, place, and time.    Cranial Nerves: Cranial nerve deficit (with left eyebrow raise) present.    Sensory: Sensation is intact.    Motor: Motor function is intact. No weakness.    Coordination: Coordination is intact. Romberg sign negative. Psychiatric:       Mood and Affect: Mood normal.       Behavior: Behavior normal.       Thought Content: Thought content normal.       Judgment: Judgment normal.  Medical Decision Making Medical Decision MakingAssessment:  Martha Ayala is a 53 y.o. female who presents with complaints of recurrent migraine headaches over the past 2 years that are becoming more frequent in the past few weeks. Reports migraine x 2 weeks with associated chest pain and left arm pain/numbness.  Patient reports hx of HTN and HLD. Family hx of CAD. Patient was alert and oriented, vital signs and nursing triage note reviewed. Also endorses a rash under her breasts.Discussion with patient that she need evaluation in the ED this evening for these symptoms. Recommend EMS transport. States she will go to the ED tomorrow after she takes her granddaughter to school. She is leaving AMA, aware of risks, see AMA documentation. Differential diagnosis:  Migraine Headache, Cluster Headache, Tension Headache, TIA/CVA, ACS, other vascular d/o Plan and Results:  POCT Glucose 117.EKG Interpretation:  Rate  73 bpm, no ST changes. Q waves leads V2-V5Normal sinus rhythm  Orders Placed This Encounter  EKG 12 lead - In Clinic  clotrimazole (LOTRIMIN) 1 % cream No results found for this or any previous visit (from the past 24 hours). Start Taking       clotrimazole (LOTRIMIN) 1 % cream Apply topically 2 times daily. to the following areas: rash under breasts  AMA NoteUrgent Care Encounter Date: 10/16/2025Patient: Martha LoftinNo admission date for patient encounter.Urgent Care Provider: Rea CHRISTELLA Jewels, PADue to high concern for headache, chest pain, arm pain/numbness, Jayah Jaime was advised that additional medical evaluation is recommended at the Emergency Department. Chetara Carstarphen has made the decision to leave the Urgent Care against the advice of Rea Jewels, [place].  Trinia Schrupp has made the decision to leave the Urgent Care and disagrees with the medical recommendations for additional medical evaluation in the Emergency Department, which is against the medical advice of Rea Jewels, [place].Mckenize Makara has been informed and understands the inherent risks to this decision, including worsening illness leading up to and including prolonged or permanent disability or death.Surena Elmes has decided to accept the responsibility for this decision.Charlsie Jakob's condition at the time of discharge was unchanged. Alexys Beckom was competent to make this decision at the time of discharge.The patient was advised that they may return to Urgent Care or go to the Emergency Department for further evaluation or treatment at any time.      Rea CHRISTELLA Jewels,  PA, 08/12/2024, 6:52 PM Final Diagnosis  ICD-10-CM ICD-9-CM 1. Acute intractable headache, unspecified headache type  R51.9 784.0 2. Pain of left upper extremity  M79.602 729.5 3. Chest pain, unspecified type  R07.9 786.50 4. Rash  R21 782.1 Patient Instructions Due to high concern for headaches, chest pain, left arm  pain, you were advised that additional medical evaluation is recommended at the Emergency Department.You have made the decision to leave the Urgent Care and disagree with the medical recommendation of additional medical evaluation in the Emergency Department tonight, which is against the medical advice of the provider at the Urgent Care today. You are opting to go to [facility] tomorrow.You have been informed and understand the inherent risks to this decision, including worsening illness leading up to and including prolonged or permanent disability or death.You have decided to accept the responsibility of this decision.Please go to the Emergency Department as soon as you can for further evaluation. Follow up for in the ED if your symptoms worsen.See AVS for further discharge instructions and recommendations.If short of breath, chest pains, worsening symptoms, or any other concerns please report to the emergency room.In the event of an Emergency dial 911. Final Diagnosis  ICD-10-CM ICD-9-CM 1. Acute intractable headache, unspecified headache type  R51.9 784.0 2. Pain of left upper extremity  M79.602 729.5 3. Chest pain, unspecified type  R07.9 786.50 4. Rash  R21 782.1 Rea CHRISTELLA Jewels, PAAuthor:  Rea CHRISTELLA Jewels, PA [1] History reviewed. No pertinent past medical history.[2] History reviewed. No pertinent surgical history.[3] History reviewed. No pertinent family history.[4] Social HistoryTobacco Use  Smoking status: Every Day   Types: Cigarettes  Smokeless tobacco: Never

## 2024-08-12 NOTE — Patient Instructions (Signed)
 Due to high concern for headaches, chest pain, left arm pain, you were advised that additional medical evaluation is recommended at the Emergency Department.You have made the decision to leave the Urgent Care and disagree with the medical recommendation of additional medical evaluation in the Emergency Department tonight, which is against the medical advice of the provider at the Urgent Care today. You are opting to go to [facility] tomorrow.You have been informed and understand the inherent risks to this decision, including worsening illness leading up to and including prolonged or permanent disability or death.You have decided to accept the responsibility of this decision.Please go to the Emergency Department as soon as you can for further evaluation.

## 2024-08-29 LAB — EKG 12-LEAD
P: 34 deg
PR: 151 ms
QRS: 11 deg
QRSD: 89 ms
QT: 392 ms
QTc: 431 ms
Rate: 73 {beats}/min
T: 14 deg

## 5784-07-28 DEATH — deceased
